# Patient Record
Sex: Female | Born: 1950 | Race: Black or African American | Hispanic: No | Smoking: Current every day smoker
Health system: Southern US, Community
[De-identification: ages and names within clinical notes are randomized; demographics above are authoritative.]

## PROBLEM LIST (undated history)

## (undated) DIAGNOSIS — B1920 Unspecified viral hepatitis C without hepatic coma: Secondary | ICD-10-CM

## (undated) DIAGNOSIS — C539 Malignant neoplasm of cervix uteri, unspecified: Secondary | ICD-10-CM

## (undated) DIAGNOSIS — B20 Human immunodeficiency virus [HIV] disease: Secondary | ICD-10-CM

## (undated) DIAGNOSIS — A630 Anogenital (venereal) warts: Secondary | ICD-10-CM

## (undated) DIAGNOSIS — B009 Herpesviral infection, unspecified: Secondary | ICD-10-CM

## (undated) DIAGNOSIS — B589 Toxoplasmosis, unspecified: Secondary | ICD-10-CM

## (undated) DIAGNOSIS — G629 Polyneuropathy, unspecified: Secondary | ICD-10-CM

## (undated) DIAGNOSIS — C649 Malignant neoplasm of unspecified kidney, except renal pelvis: Secondary | ICD-10-CM

## (undated) DIAGNOSIS — N2889 Other specified disorders of kidney and ureter: Secondary | ICD-10-CM

## (undated) HISTORY — DX: Malignant neoplasm of cervix uteri, unspecified: C53.9

## (undated) HISTORY — DX: Malignant neoplasm of unspecified kidney, except renal pelvis: C64.9

---

## 2011-12-02 ENCOUNTER — Encounter (HOSPITAL_COMMUNITY): Payer: Self-pay | Admitting: *Deleted

## 2011-12-02 ENCOUNTER — Emergency Department (INDEPENDENT_AMBULATORY_CARE_PROVIDER_SITE_OTHER)
Admission: EM | Admit: 2011-12-02 | Discharge: 2011-12-02 | Disposition: A | Discharge status: Home / Self Care | Payer: Medicaid Other | Source: Home / Self Care | Attending: Family Medicine | Admitting: Family Medicine

## 2011-12-02 DIAGNOSIS — N390 Urinary tract infection, site not specified: Secondary | ICD-10-CM

## 2011-12-02 HISTORY — DX: Human immunodeficiency virus (HIV) disease: B20

## 2011-12-02 LAB — POCT URINALYSIS DIP (DEVICE)
Glucose, UA: NEGATIVE mg/dL
Nitrite: NEGATIVE
Urobilinogen, UA: 0.2 mg/dL (ref 0.0–1.0)
pH: 6.5 (ref 5.0–8.0)

## 2011-12-02 MED ORDER — CEPHALEXIN 500 MG PO CAPS: 500.0000 mg | ORAL_CAPSULE | Freq: Four times a day (QID) | ORAL | Status: AC

## 2011-12-02 NOTE — ED Notes (Signed)
Pt  Seen  Seen  sev   Days  Ago  For  uti   She  Continues  To  Have  Symptoms  Of  painfull   Urination  Low  abd   She is  Taking  meds   Percocet    And  Bactrim         At  This  Time  -  Pt  States  She  Has  Aids  And      Is  Not taking  Any meds  For  Same      And  That      She  Recently  Came  Here  From  [place] yok  And  She  Has  No  Dr       -

## 2011-12-02 NOTE — ED Provider Notes (Addendum)
History     CSN: [id number]  Arrival date & time 12/02/11  1524   First MD Initiated Contact with Patient 12/02/11 1549      Chief Complaint  Patient presents with  . Urinary Tract Infection    (Consider location/radiation/quality/duration/timing/severity/associated sxs/prior treatment) Patient is a 61 y.o. female presenting with urinary tract infection. The history is provided by the patient.  Urinary Tract Infection This is a new problem. The current episode started more than 1 week ago. The problem has not changed (treated by lmd in [place] with bactrim, sx continue) since onset.Associated symptoms include abdominal pain. Associated symptoms comments: Has long and numerous medical problem list, requesting md for hiv and pain medicine..    Past Medical History  Diagnosis Date  . AIDS     History reviewed. No pertinent past surgical history.  History reviewed. No pertinent family history.  History  Substance Use Topics  . Smoking status: Not on file  . Smokeless tobacco: Not on file  . Alcohol Use:     OB History    Grav Para Term Preterm Abortions TAB SAB Ect Mult Living                  Review of Systems  Constitutional: Negative.   Gastrointestinal: Positive for abdominal pain. Negative for nausea and vomiting.  Genitourinary: Positive for dysuria and frequency.    Allergies  Aspirin  Home Medications  No current outpatient prescriptions on file.  BP 132/86  Pulse 74  Temp 97.7 F (36.5 C) (Oral)  Resp 20  SpO2 100%  Physical Exam  Nursing note and vitals reviewed. Constitutional: She is oriented to person, place, and time. She appears well-developed.  Abdominal: Soft. Bowel sounds are normal. There is tenderness. There is no rebound.  Neurological: She is alert and oriented to person, place, and time.  Skin: Skin is warm and dry.    ED Course  Procedures (including critical care time)  Labs Reviewed  POCT URINALYSIS DIP (DEVICE) - Abnormal;  Notable for the following:    Hgb urine dipstick TRACE (*)     Protein, ur 30 (*)     Leukocytes, UA MODERATE (*)  Biochemical Testing Only. Please order routine urinalysis from main lab if confirmatory testing is needed.   All other components within normal limits   No results found.   1. UTI (lower urinary tract infection)       MDM         Linna Hoff, MD 12/02/11 1636  Linna Hoff, MD 12/02/11 [phone]

## 2011-12-09 ENCOUNTER — Emergency Department (HOSPITAL_COMMUNITY)
Admission: EM | Admit: 2011-12-09 | Discharge: 2011-12-09 | Disposition: A | Discharge status: Home / Self Care | Payer: Medicaid Other | Attending: Emergency Medicine | Admitting: Emergency Medicine

## 2011-12-09 ENCOUNTER — Encounter (HOSPITAL_COMMUNITY): Payer: Self-pay | Admitting: Emergency Medicine

## 2011-12-09 DIAGNOSIS — R209 Unspecified disturbances of skin sensation: Secondary | ICD-10-CM | POA: Insufficient documentation

## 2011-12-09 DIAGNOSIS — M545 Low back pain, unspecified: Secondary | ICD-10-CM | POA: Insufficient documentation

## 2011-12-09 DIAGNOSIS — N898 Other specified noninflammatory disorders of vagina: Secondary | ICD-10-CM | POA: Insufficient documentation

## 2011-12-09 DIAGNOSIS — B009 Herpesviral infection, unspecified: Secondary | ICD-10-CM | POA: Insufficient documentation

## 2011-12-09 DIAGNOSIS — B20 Human immunodeficiency virus [HIV] disease: Secondary | ICD-10-CM | POA: Insufficient documentation

## 2011-12-09 DIAGNOSIS — Z79899 Other long term (current) drug therapy: Secondary | ICD-10-CM | POA: Insufficient documentation

## 2011-12-09 DIAGNOSIS — G629 Polyneuropathy, unspecified: Secondary | ICD-10-CM | POA: Insufficient documentation

## 2011-12-09 DIAGNOSIS — R5381 Other malaise: Secondary | ICD-10-CM | POA: Insufficient documentation

## 2011-12-09 DIAGNOSIS — M79609 Pain in unspecified limb: Secondary | ICD-10-CM | POA: Insufficient documentation

## 2011-12-09 DIAGNOSIS — B589 Toxoplasmosis, unspecified: Secondary | ICD-10-CM | POA: Insufficient documentation

## 2011-12-09 DIAGNOSIS — N939 Abnormal uterine and vaginal bleeding, unspecified: Secondary | ICD-10-CM

## 2011-12-09 DIAGNOSIS — R5383 Other fatigue: Secondary | ICD-10-CM | POA: Insufficient documentation

## 2011-12-09 DIAGNOSIS — N12 Tubulo-interstitial nephritis, not specified as acute or chronic: Secondary | ICD-10-CM | POA: Insufficient documentation

## 2011-12-09 DIAGNOSIS — R350 Frequency of micturition: Secondary | ICD-10-CM | POA: Insufficient documentation

## 2011-12-09 DIAGNOSIS — R109 Unspecified abdominal pain: Secondary | ICD-10-CM | POA: Insufficient documentation

## 2011-12-09 HISTORY — DX: Toxoplasmosis, unspecified: B58.9

## 2011-12-09 HISTORY — DX: Polyneuropathy, unspecified: G62.9

## 2011-12-09 HISTORY — DX: Herpesviral infection, unspecified: B00.9

## 2011-12-09 LAB — COMPREHENSIVE METABOLIC PANEL
AST: 62 U/L — ABNORMAL HIGH (ref 0–37)
Albumin: 2.7 g/dL — ABNORMAL LOW (ref 3.5–5.2)
CO2: 25 mEq/L (ref 19–32)
Calcium: 9.4 mg/dL (ref 8.4–10.5)
Creatinine, Ser: 0.68 mg/dL (ref 0.50–1.10)
GFR calc non Af Amer: >90 mL/min (ref >90)
Total Protein: 8.6 g/dL — ABNORMAL HIGH (ref 6.0–8.3)

## 2011-12-09 LAB — CBC WITH DIFFERENTIAL/PLATELET
Basophils Absolute: 0 10*3/uL (ref 0.0–0.1)
Eosinophils Absolute: 0.5 10*3/uL (ref 0.0–0.7)
Lymphs Abs: 1.3 10*3/uL (ref 0.7–4.0)
MCH: 23.6 pg — ABNORMAL LOW (ref 26.0–34.0)
MCHC: 32.9 g/dL (ref 30.0–36.0)
MCV: 71.6 fL — ABNORMAL LOW (ref 78.0–100.0)
Monocytes Absolute: 0.5 10*3/uL (ref 0.1–1.0)
Neutrophils Relative %: 49 % (ref 43–77)
Platelets: UNDETERMINED 10*3/uL (ref 150–400)
RDW: 17 % — ABNORMAL HIGH (ref 11.5–15.5)

## 2011-12-09 LAB — URINALYSIS, ROUTINE W REFLEX MICROSCOPIC
Bilirubin Urine: NEGATIVE
Hgb urine dipstick: NEGATIVE
Nitrite: NEGATIVE
Protein, ur: NEGATIVE mg/dL
Urobilinogen, UA: 0.2 mg/dL (ref 0.0–1.0)

## 2011-12-09 LAB — URINE MICROSCOPIC-ADD ON

## 2011-12-09 MED ORDER — SODIUM CHLORIDE 0.9 % IV BOLUS (SEPSIS)
1000.0000 mL | Freq: Once | INTRAVENOUS | Status: AC
  Administered 2011-12-09: 1000 mL via INTRAVENOUS

## 2011-12-09 MED ORDER — CIPROFLOXACIN HCL 500 MG PO TABS: 500.0000 mg | ORAL_TABLET | Freq: Two times a day (BID) | ORAL | Status: AC

## 2011-12-09 MED ORDER — ONDANSETRON HCL 4 MG/2ML IJ SOLN
4.0000 mg | INTRAMUSCULAR | Status: DC | PRN
  Administered 2011-12-09: 4 mg via INTRAVENOUS
  Filled 2011-12-09: qty 2

## 2011-12-09 MED ORDER — OXYCODONE-ACETAMINOPHEN 5-325 MG PO TABS: ORAL_TABLET | ORAL | Status: AC

## 2011-12-09 MED ORDER — MORPHINE SULFATE 4 MG/ML IJ SOLN
4.0000 mg | Freq: Once | INTRAMUSCULAR | Status: AC
  Administered 2011-12-09: 4 mg via INTRAVENOUS
  Filled 2011-12-09: qty 1

## 2011-12-09 MED ORDER — DEXTROSE 5 % IV SOLN
1.0000 g | Freq: Once | INTRAVENOUS | Status: AC
  Administered 2011-12-09: 1 g via INTRAVENOUS
  Filled 2011-12-09: qty 10

## 2011-12-09 NOTE — ED Provider Notes (Signed)
History     CSN: [id number]  Arrival date & time 12/09/11  1225   First MD Initiated Contact with Patient 12/09/11 1541      Chief Complaint  Patient presents with  . Back Pain  . Vaginal Discharge    (Consider location/radiation/quality/duration/timing/severity/associated sxs/prior treatment) HPI Comments: Patient with a hx of HIV/AIDS since 1990, toxoplasmosis, & herpes moved from [place] about a month ago & presents to ED w cc of low back and supra pubic pain.She reports low back pain that radiates to back of legs and groin with an onset of about one month ago, gradually worsening.She was seen by urgent care last week and dx w a UTI, treated with bactrim & keflex. SHe states she has been taking antibiotics for 7 days with out relief of back pain or urinary s/s including urinary frequency ("all day and night like every 5 min"). She also reports a chronic right side neuropathy that is worsening, yellow discharge with out an odor, & post menopausal cervical bleeding.  She reports her last CD4 count was about 6 months ago & was 12. She is not currently taking viral medications bc they  "turn toxic on me."  Patient denies any bowel or bladder incontinence, fevers, new extremity weakness or numbness, inability to ambulate, nausea, vomiting, or saddle paresthesias. Pt has been taking percocet for pain and has been out for 3 days.    Patient is a 61 y.o. female presenting with back pain and vaginal discharge. The history is provided by the patient.  Back Pain  Pertinent negatives include no fever, no numbness and no weakness.  Vaginal Discharge Associated symptoms include fatigue. Pertinent negatives include no chills, diaphoresis, fever, neck pain, numbness or weakness.    Past Medical History  Diagnosis Date  . AIDS   . Neuropathy   . Herpes   . Toxoplasmosis     History reviewed. No pertinent past surgical history.  History reviewed. No pertinent family history.  History  Substance Use  Topics  . Smoking status: Current Everyday Smoker  . Smokeless tobacco: Not on file  . Alcohol Use: No    OB History    Grav Para Term Preterm Abortions TAB SAB Ect Mult Living                  Review of Systems  Constitutional: Positive for fatigue. Negative for fever, chills, diaphoresis and unexpected weight change.  HENT: Negative for neck pain and neck stiffness.   Genitourinary: Positive for vaginal discharge.  Musculoskeletal: Positive for back pain.  Neurological: Negative for speech difficulty, weakness and numbness.    Allergies  Aspirin  Home Medications   Current Outpatient Rx  Name Route Sig Dispense Refill  . CEPHALEXIN 500 MG PO CAPS Oral Take 1 capsule (500 mg total) by mouth 4 (four) times daily. Take all of medicine and drink lots of fluids 20 capsule 0  . OXYCODONE-ACETAMINOPHEN 10-325 MG PO TABS Oral Take 1 tablet by mouth every 4 (four) hours as needed. For pain    . SULFAMETHOXAZOLE-TMP DS 800-160 MG PO TABS Oral Take 1 tablet by mouth 2 (two) times daily.      BP 128/93  Pulse 72  Temp 98.2 F (36.8 C) (Oral)  Resp 16  SpO2 100%  Physical Exam  Constitutional: She is oriented to person, place, and time. She appears well-developed and well-nourished. No distress.  HENT:  Head: Normocephalic and atraumatic.  Mouth/Throat: Oropharynx is clear and moist. No oropharyngeal exudate.  Eyes: Conjunctivae and EOM are normal. Pupils are equal, round, and reactive to light. No scleral icterus.  Neck: Normal range of motion. Neck supple. No tracheal deviation present. No thyromegaly present.  Cardiovascular: Normal rate, regular rhythm, normal heart sounds and intact distal pulses.   Pulmonary/Chest: Effort normal and breath sounds normal. No stridor. No respiratory distress. She has no wheezes.  Abdominal: Soft. There is tenderness.         Suprapubic abdominal tenderness to palpation.  Bowel sounds present and normal, no masses or distention.    Genitourinary:       Left sided CVA tenderness, external genitalia normal, rectal condylomata present. NO tender lesions. Bimanual non tender with out palpable masses. Cervix unable to be visualized d/t amount of vaginal bleeding. No purulent dc seen.   Musculoskeletal: Normal range of motion. She exhibits tenderness. She exhibits no edema.       Lumbar and left CVA tenderness.   Neurological: She is alert and oriented to person, place, and time. Coordination normal.       Patellar reflexes equal bilaterally.  Strength 5/5 in all extremities except right lower extremity 4/5.  Patient able to ambulate in the emergency department without difficulty.  Skin: Skin is warm and dry. No rash noted. She is not diaphoretic. No erythema. No pallor.  Psychiatric: She has a normal mood and affect. Her behavior is normal.    ED Course  Procedures (including critical care time)  Labs Reviewed  URINALYSIS, ROUTINE W REFLEX MICROSCOPIC - Abnormal; Notable for the following:    APPearance HAZY (*)     Leukocytes, UA MODERATE (*)     All other components within normal limits  CBC WITH DIFFERENTIAL - Abnormal; Notable for the following:    Hemoglobin 10.6 (*)     HCT 32.2 (*)     MCV 71.6 (*)     MCH 23.6 (*)     RDW 17.0 (*)     Eosinophils Relative 11 (*)     All other components within normal limits  COMPREHENSIVE METABOLIC PANEL - Abnormal; Notable for the following:    Total Protein 8.6 (*)     Albumin 2.7 (*)     AST 62 (*)     Total Bilirubin 0.1 (*)     All other components within normal limits  URINE MICROSCOPIC-ADD ON - Abnormal; Notable for the following:    Squamous Epithelial / LPF FEW (*)     All other components within normal limits  POCT PREGNANCY, URINE  URINE CULTURE  GC/CHLAMYDIA PROBE AMP, GENITAL  WET PREP, GENITAL   No results found.   No diagnosis found.    MDM    Patient treated in the emergency department with 1 g of Rocephin.  She will be discharged on  different medications because the Keflex and Bactrim has not worked for her urinary tract infection.  Patient will be given or problem.  Advised patient to contact health serve for followup with infectious disease and women[facility] clinic for vaginal bleeding.  Patient is stable in no acute distress prior to discharge, sitting comfortably in bed and eating. Pt case was discussed with Dr. Lynelle Doctor who agrees with my plan to dc pt with strict follow up instructions. Pt verbalizes understanding.         Jaci Carrel, [place] 12/09/11 [phone]

## 2011-12-09 NOTE — ED Notes (Signed)
Pt c/o lower back pain with radiation to bil legs x 3 days; pt recently being treated for UTI; pt sts just moved here from [place] and is out of pain meds; pt sts not taking HIV meds

## 2011-12-09 NOTE — ED Notes (Signed)
Pt also c/o yellow vaginal discharge and vaginal bleeding; pt denies odor

## 2011-12-09 NOTE — ED Notes (Signed)
Unable to complete pelvic sample due to large amounts of blood. Pt given washcloths and pads.

## 2011-12-09 NOTE — ED Provider Notes (Signed)
Medical screening examination/treatment/procedure(s) were conducted as a shared visit with non-physician practitioner(s) and myself.  I personally evaluated the patient during the encounter  Pt with several medical problems.  No PCP in this area.  Still having back pain.  Non toxic.  No guarding or rigidity on abdominal exam.  Mild ttp left lq.  On exam she does have flank tenderness and a persistent uti.  Will give dose of IV abx, change her abx regimen.   Celene Kras, MD 12/09/11 [phone]

## 2012-01-14 ENCOUNTER — Encounter (HOSPITAL_COMMUNITY): Payer: Self-pay

## 2012-01-14 ENCOUNTER — Inpatient Hospital Stay (HOSPITAL_COMMUNITY): Payer: Medicaid Other

## 2012-01-14 ENCOUNTER — Inpatient Hospital Stay (HOSPITAL_COMMUNITY)
Admission: AD | Admit: 2012-01-14 | Discharge: 2012-01-14 | Disposition: A | Discharge status: Home / Self Care | Payer: Medicaid Other | Source: Ambulatory Visit | Attending: Obstetrics & Gynecology | Admitting: Obstetrics & Gynecology

## 2012-01-14 DIAGNOSIS — N939 Abnormal uterine and vaginal bleeding, unspecified: Secondary | ICD-10-CM

## 2012-01-14 DIAGNOSIS — N888 Other specified noninflammatory disorders of cervix uteri: Secondary | ICD-10-CM

## 2012-01-14 DIAGNOSIS — N938 Other specified abnormal uterine and vaginal bleeding: Secondary | ICD-10-CM | POA: Insufficient documentation

## 2012-01-14 DIAGNOSIS — N898 Other specified noninflammatory disorders of vagina: Secondary | ICD-10-CM

## 2012-01-14 DIAGNOSIS — N949 Unspecified condition associated with female genital organs and menstrual cycle: Secondary | ICD-10-CM | POA: Insufficient documentation

## 2012-01-14 HISTORY — DX: Human immunodeficiency virus (HIV) disease: B20

## 2012-01-14 HISTORY — DX: Unspecified viral hepatitis C without hepatic coma: B19.20

## 2012-01-14 HISTORY — DX: Other specified disorders of kidney and ureter: N28.89

## 2012-01-14 HISTORY — DX: Anogenital (venereal) warts: A63.0

## 2012-01-14 LAB — URINALYSIS, ROUTINE W REFLEX MICROSCOPIC
Bilirubin Urine: NEGATIVE
Nitrite: NEGATIVE
Specific Gravity, Urine: 1.015 (ref 1.005–1.030)
pH: 6 (ref 5.0–8.0)

## 2012-01-14 LAB — CBC
HCT: 28.8 % — ABNORMAL LOW (ref 36.0–46.0)
Hemoglobin: 9.1 g/dL — ABNORMAL LOW (ref 12.0–15.0)
MCH: 22.5 pg — ABNORMAL LOW (ref 26.0–34.0)
MCV: 71.1 fL — ABNORMAL LOW (ref 78.0–100.0)
RBC: 4.05 MIL/uL (ref 3.87–5.11)

## 2012-01-14 LAB — URINE MICROSCOPIC-ADD ON

## 2012-01-14 LAB — WET PREP, GENITAL
WBC, Wet Prep HPF POC: NONE SEEN
Yeast Wet Prep HPF POC: NONE SEEN

## 2012-01-14 MED ORDER — OXYCODONE-ACETAMINOPHEN 5-325 MG PO TABS: 2.0000 | ORAL_TABLET | ORAL | Status: DC | PRN

## 2012-01-14 MED ORDER — KETOROLAC TROMETHAMINE 60 MG/2ML IM SOLN: 60.0000 mg | Freq: Once | INTRAMUSCULAR | Status: DC

## 2012-01-14 MED ORDER — OXYCODONE-ACETAMINOPHEN 5-325 MG PO TABS
1.0000 | ORAL_TABLET | Freq: Once | ORAL | Status: AC
  Administered 2012-01-14: 1 via ORAL
  Filled 2012-01-14: qty 1

## 2012-01-14 NOTE — MAU Note (Addendum)
Back pain and leg pain reports vaginal bleeding for several months, varies in amount, none at present, has history of vaginal warts, HIV positive, herpes outbreak currently, Hepatitis C, currently using crack cocaine.

## 2012-01-14 NOTE — MAU Provider Note (Signed)
History     CSN: [id number]  Arrival date and time: 01/14/12 1013   First Provider Initiated Contact with Patient 01/14/12 1051      Chief Complaint  Patient presents with  . Vaginal Bleeding  . Back Pain   HPI Christine Vincent 61 y.o. Comes to MAU today with vaginal bleeding that occurred 3 days ago.  No vaginal bleeding today but client states that with a vaginal exam she will have bleeding.  Was seen at [facility] on 01-07-12 by infectious disease and had vaginal bleeding with pelvic exam and provider could not finish the exam.  Has [place] and is getting established here.  She has numerous chronic problems including HIV and a known mass on her kidney.  States she has an appointment at [facility] on 01-27-12 with the pharmacy to get her HIV medications. Currently is not taking HIV meds.  Diagosed in 1990 per client.  Also has an appointment at [facility] on 01-28-12 with Primary Care.  States she was scheduled for OB/GYN appointment as well in October (does not remember the specific day, but states she has it written down).  Client is crying with pain in her groin, lower abdomen and back.  Wants medication for pain and has not taken any medication for pain today.  Client reports she did use cocaine for pain control earlier this week.  OB History    Grav Para Term Preterm Abortions TAB SAB Ect Mult Living                  Past Medical History  Diagnosis Date  . AIDS   . Neuropathy   . Herpes   . Toxoplasmosis   . HIV disease   . Herpes   . Neuropathy   . Kidney mass   . Hepatitis C virus   . Vaginal venereal warts     No past surgical history on file.  Family History  Problem Relation Age of Onset  . Hypertension Maternal Grandmother     History  Substance Use Topics  . Smoking status: Current Everyday Smoker  . Smokeless tobacco: Not on file  . Alcohol Use: No    Allergies:  Allergies  Allergen Reactions  . Aspirin Hives, Itching and Nausea And Vomiting      Prescriptions prior to admission  Medication Sig Dispense Refill  . ciprofloxacin (CIPRO) 500 MG tablet Take 500 mg by mouth 2 (two) times daily.      Marland Kitchen oxyCODONE-acetaminophen (PERCOCET) 10-325 MG per tablet Take 1 tablet by mouth every 4 (four) hours as needed. For pain      . sulfamethoxazole-trimethoprim (BACTRIM DS,SEPTRA DS) 800-160 MG per tablet Take 1 tablet by mouth 2 (two) times daily. Bottle says take 2 times a week      . valACYclovir (VALTREX) 1000 MG tablet Take 1,000 mg by mouth daily.        Review of Systems  Constitutional: Negative for fever.  Gastrointestinal: Positive for abdominal pain and constipation. Negative for diarrhea.  Genitourinary: Negative for dysuria.       Vaginal bleeding HSV outbreak currently Genital warts   Physical Exam   Blood pressure 134/95, pulse 84, temperature 98.1 F (36.7 C), temperature source Oral, resp. rate 16.  Physical Exam  Nursing note and vitals reviewed. Constitutional: She is oriented to person, place, and time. She appears well-developed and well-nourished.  HENT:  Head: Normocephalic.       Very poor dentition.  Only  has a few teeth with obvious decay at base of teeth.  Eyes: EOM are normal.  Neck: Neck supple.  GI: Soft. There is no rebound and no guarding.       Reports tenderness all across lower abdomen with palpation.  Genitourinary:       Speculum exam: Vulva - Multiple Hypopigmented papules seen on either side of rectum - nontender.  Erosion seen in folds between labia majora and labia minora - very tender.  Cloudy discharge in small stream from introitus Vagina - Initially with speculum, no bleeding seen.  Opened speculum twice to visualize cervix and bleeding began.  Cervix has highly exophytic and geographic appearance.  Almost immediately, speculum filled with liquid blood and small clots.   Cervix - heavy contact bleeding noted Bimanual exam: Cervix long, soft with soft, irregular texture rather than  smooth texture to palpation Uterus non tender, very firm, retroflexed, 8 week size Adnexa non tender, no masses bilaterally GC/Chlam, wet prep done Chaperone present for exam.  Musculoskeletal: Normal range of motion.  Neurological: She is alert and oriented to person, place, and time.  Skin: Skin is warm and dry.  Psychiatric: She has a normal mood and affect.    MAU Course  Procedures *RADIOLOGY REPORT*  Clinical Data: Pelvic pain. Post menopausal bleeding.  TRANSABDOMINAL AND TRANSVAGINAL ULTRASOUND OF PELVIS  Technique: Both transabdominal and transvaginal ultrasound  examinations of the pelvis were performed. Transabdominal  technique was performed for global imaging of the pelvis including  uterus, ovaries, adnexal regions, and pelvic cul-de-sac.  It was necessary to proceed with endovaginal exam following the  transabdominal exam to visualize the endometrium and ovaries.  Comparison: None.  Findings:  Uterus: 9.4 x 5.0 x 5.4 cm. Retroflexed. Diffusely heterogeneous  echogenicity of uterine myometrium noted, without distinct fibroids  identified. Hypoechoic fluid or blood seen within the endocervical  canal. The posterior wall of the cervix appears enlarged with an  ill-defined hyperechoic area measuring approximately 2.9 x 4.8 by  2.9 cm. A posterior cervical mass cannot be excluded.  Endometrium: Tiny amount of fluid seen in the fundal portion of  endometrial cavity. Double layer thickness excluding the fluid  measures 9 mm transvaginally. No focal lesion visualized.  Right ovary: 2.5 x 2.0 x 2.5 cm. Normal appearance. No adnexal  mass identified.  Left ovary: not directly visualized by transabdominal or  transvaginal sonography, however no adnexal mass identified.  Other Findings: No free fluid  IMPRESSION:  1. Endometrial thickness measures 9 mm transvaginally. In the  setting of post-menopausal bleeding, endometrial sampling is  indicated to exclude carcinoma. If  results are benign,  sonohysterogram should be considered for focal lesion work-up.  (Ref: Radiological Reasoning: Algorithmic Workup of Abnormal  Vaginal Bleeding with Endovaginal Sonography and Sonohysterography.  AJR 2008; 130:Q65-78)  2. Question mass in the posterior cervical wall. Cervical  carcinoma cannot be excluded by imaging. Recommend clinical  correlation with pelvic exam, and consider pelvic MRI without and  with contrast for further evaluation if clinically warranted.  3. Normal right ovary. Nonvisualization of left ovary, however no  adnexal mass identified.   MDM 1130 am Consult with Dr. Debroah Loop re: plan of care Results for orders placed during the hospital encounter of 01/14/12 (from the past 24 hour(s))  URINALYSIS, ROUTINE W REFLEX MICROSCOPIC     Status: Abnormal   Collection Time   01/14/12 10:17 AM      Component Value Range   Color, Urine YELLOW  YELLOW  APPearance CLEAR  CLEAR   Specific Gravity, Urine 1.015  1.005 - 1.030   pH 6.0  5.0 - 8.0   Glucose, UA NEGATIVE  NEGATIVE mg/dL   Hgb urine dipstick TRACE (*) NEGATIVE   Bilirubin Urine NEGATIVE  NEGATIVE   Ketones, ur NEGATIVE  NEGATIVE mg/dL   Protein, ur NEGATIVE  NEGATIVE mg/dL   Urobilinogen, UA 0.2  0.0 - 1.0 mg/dL   Nitrite NEGATIVE  NEGATIVE   Leukocytes, UA SMALL (*) NEGATIVE  URINE MICROSCOPIC-ADD ON     Status: Abnormal   Collection Time   01/14/12 10:17 AM      Component Value Range   Squamous Epithelial / LPF FEW (*) RARE   WBC, UA 11-20  <3 WBC/hpf   RBC / HPF 0-2  <3 RBC/hpf   Bacteria, UA RARE  RARE  WET PREP, GENITAL     Status: Normal   Collection Time   01/14/12 10:40 AM      Component Value Range   Yeast Wet Prep HPF POC NONE SEEN  NONE SEEN   Trich, Wet Prep NONE SEEN  NONE SEEN   Clue Cells Wet Prep HPF POC NONE SEEN  NONE SEEN   WBC, Wet Prep HPF POC NONE SEEN  NONE SEEN  CBC     Status: Abnormal   Collection Time   01/14/12 11:30 AM      Component Value Range   WBC 5.7   4.0 - 10.5 K/uL   RBC 4.05  3.87 - 5.11 MIL/uL   Hemoglobin 9.1 (*) 12.0 - 15.0 g/dL   HCT 40.9 (*) 81.1 - 91.4 %   MCV 71.1 (*) 78.0 - 100.0 fL   MCH 22.5 (*) 26.0 - 34.0 pg   MCHC 31.6  30.0 - 36.0 g/dL   RDW 78.2 (*) 95.6 - 21.3 %   Platelets 58 (*) 150 - 400 K/uL   Assessment and Plan  Abnormal vaginal bleeding likely from cervical mass and possibly from thickened endometrial lining Numerous health problems which are currently not being treated.  Plan Will need to see OB/GYN MD for further evaluation. Will need endometrial biopsy and pelvic MRI for evaluation. Nothing in the vagina - no sex, no tampons, no douching. Cultures pending. Client reports she has appointments in [place] on 8-27 and 8-28.  Advise she tell her providers about the visit here today so her records can be reviewed. Advised if she needs care prior to that time, she will need to be seen in the ER in [place] so her doctors in [place] can be consulted.  Jerriyah Louis 01/14/2012, 11:01 AM

## 2012-01-15 LAB — GC/CHLAMYDIA PROBE AMP, GENITAL: Chlamydia, DNA Probe: NEGATIVE

## 2012-01-20 ENCOUNTER — Emergency Department (HOSPITAL_COMMUNITY): Payer: Medicaid Other

## 2012-01-20 ENCOUNTER — Emergency Department (HOSPITAL_COMMUNITY)
Admission: EM | Admit: 2012-01-20 | Discharge: 2012-01-20 | Disposition: A | Discharge status: Home / Self Care | Payer: Medicaid Other | Attending: Emergency Medicine | Admitting: Emergency Medicine

## 2012-01-20 ENCOUNTER — Encounter (HOSPITAL_COMMUNITY): Payer: Self-pay | Admitting: *Deleted

## 2012-01-20 DIAGNOSIS — R5383 Other fatigue: Secondary | ICD-10-CM | POA: Insufficient documentation

## 2012-01-20 DIAGNOSIS — M545 Low back pain, unspecified: Secondary | ICD-10-CM | POA: Insufficient documentation

## 2012-01-20 DIAGNOSIS — Z79899 Other long term (current) drug therapy: Secondary | ICD-10-CM | POA: Insufficient documentation

## 2012-01-20 DIAGNOSIS — W1809XA Striking against other object with subsequent fall, initial encounter: Secondary | ICD-10-CM | POA: Insufficient documentation

## 2012-01-20 DIAGNOSIS — Z8619 Personal history of other infectious and parasitic diseases: Secondary | ICD-10-CM | POA: Insufficient documentation

## 2012-01-20 DIAGNOSIS — T148XXA Other injury of unspecified body region, initial encounter: Secondary | ICD-10-CM

## 2012-01-20 DIAGNOSIS — W1800XA Striking against unspecified object with subsequent fall, initial encounter: Secondary | ICD-10-CM

## 2012-01-20 DIAGNOSIS — S0990XA Unspecified injury of head, initial encounter: Secondary | ICD-10-CM | POA: Insufficient documentation

## 2012-01-20 DIAGNOSIS — R5381 Other malaise: Secondary | ICD-10-CM | POA: Insufficient documentation

## 2012-01-20 LAB — URINE CULTURE

## 2012-01-20 MED ORDER — OXYCODONE-ACETAMINOPHEN 10-325 MG PO TABS: 1.0000 | ORAL_TABLET | Freq: Three times a day (TID) | ORAL | Status: DC | PRN

## 2012-01-20 MED ORDER — OXYCODONE-ACETAMINOPHEN 5-325 MG PO TABS
2.0000 | ORAL_TABLET | Freq: Once | ORAL | Status: AC
  Administered 2012-01-20: 2 via ORAL
  Filled 2012-01-20: qty 2

## 2012-01-20 NOTE — ED Provider Notes (Signed)
Medical screening examination/treatment/procedure(s) were performed by non-physician practitioner and as supervising physician I was immediately available for consultation/collaboration.  Derwood Kaplan, MD 01/20/12 [phone]

## 2012-01-20 NOTE — Discharge Instructions (Signed)
Contusion A contusion is a deep bruise. Contusions happen when an injury causes bleeding under the skin. Signs of bruising include pain, puffiness (swelling), and discolored skin. The contusion may turn blue, purple, or yellow. HOME CARE   Put ice on the injured area.   Put ice in a plastic bag.   Place a towel between your skin and the bag.   Leave the ice on for 15 to 20 minutes, 3 to 4 times a day.   Only take medicine as told by your doctor.   Rest the injured area.   If possible, raise (elevate) the injured area to lessen puffiness.  GET HELP RIGHT AWAY IF:   You have more bruising or puffiness.   You have pain that is getting worse.   Your puffiness or pain is not helped by medicine.  MAKE SURE YOU:   Understand these instructions.   Will watch your condition.   Will get help right away if you are not doing well or get worse.  Document Released: 11/05/2007 Document Revised: 05/08/2011 Document Reviewed: 03/24/2011 [facility] Patient Information 2012 [place], [place].  Fortunately, her x-rays show no fractures or intracranial brain injuries

## 2012-01-20 NOTE — ED Notes (Signed)
EMS reports after they gave her 350 mcg of Fentanyl she told them she did a small rock of cocaine

## 2012-01-20 NOTE — ED Provider Notes (Signed)
History     CSN: [id number]  Arrival date & time 01/20/12  0117   First MD Initiated Contact with Patient 01/20/12 0158      Chief Complaint  Patient presents with  . Fall    (Consider location/radiation/quality/duration/timing/severity/associated sxs/prior treatment) HPI Comments: Patient states she has chronic low back pain, radiating to her left leg and weakness.  States she was attempting to get off the toilet, and her leg gave out.  She fell 4 hit her head on the wall, and then fell backward, hitting her lower back on the sink.  She laid on the floor for approximately one hour before EMS arrived to assist.  Her.  She states she is out of her Percocet for the last 3 or 4 days.  She is waiting for an appointment for primary care physician at [facility].  She take place, the end of this month.  She recently moved to [place]  Patient is a 61 y.o. female presenting with fall. The history is provided by the patient.  Fall The accident occurred 1 to 2 hours ago. The fall occurred from a stool. She fell from a height of 1 to 2 ft. She landed on a hard floor. Pertinent negatives include no fever and no abdominal pain.    Past Medical History  Diagnosis Date  . AIDS   . Neuropathy   . Herpes   . Toxoplasmosis   . HIV disease   . Herpes   . Neuropathy   . Kidney mass   . Hepatitis C virus   . Vaginal venereal warts     History reviewed. No pertinent past surgical history.  Family History  Problem Relation Age of Onset  . Hypertension Maternal Grandmother     History  Substance Use Topics  . Smoking status: Current Everyday Smoker  . Smokeless tobacco: Not on file  . Alcohol Use: No    OB History    Grav Para Term Preterm Abortions TAB SAB Ect Mult Living                  Review of Systems  Constitutional: Negative for fever and chills.  Respiratory: Negative for shortness of breath.   Cardiovascular: Negative for leg swelling.  Gastrointestinal:  Negative for abdominal pain.  Musculoskeletal: Positive for back pain. Negative for myalgias and joint swelling.  Skin: Negative for rash and wound.  Neurological: Positive for weakness. Negative for dizziness.    Allergies  Aspirin  Home Medications   Current Outpatient Rx  Name Route Sig Dispense Refill  . CIPROFLOXACIN HCL 500 MG PO TABS Oral Take 500 mg by mouth 2 (two) times daily.    . OXYCODONE-ACETAMINOPHEN 10-325 MG PO TABS Oral Take 1 tablet by mouth every 4 (four) hours as needed. For pain    . SULFAMETHOXAZOLE-TRIMETHOPRIM 800-160 MG PO TABS Oral Take 1 tablet by mouth 2 (two) times daily. Bottle says take 2 times a week    . VALACYCLOVIR HCL 1 G PO TABS Oral Take 1,000 mg by mouth daily.      BP 149/93  Pulse 79  Temp 98.9 F (37.2 C) (Oral)  Resp 20  SpO2 94%  Physical Exam  Constitutional: She is oriented to person, place, and time. She appears well-developed and well-nourished.  HENT:  Head: Normocephalic.  Eyes: Pupils are equal, round, and reactive to light.  Neck: Normal range of motion.       Meets NEXIUS criteria  Cardiovascular: Normal rate.  Pulmonary/Chest: Effort normal.  Abdominal: Soft. She exhibits no distension. There is no tenderness.  Musculoskeletal: Normal range of motion. She exhibits tenderness.       Arms: Neurological: She is alert and oriented to person, place, and time.  Skin: Skin is warm.    ED Course  Procedures (including critical care time)  Labs Reviewed - No data to display Dg Lumbar Spine Complete  01/20/2012  *RADIOLOGY REPORT*  Clinical Data: Low back pain after fall.  LUMBAR SPINE - COMPLETE 4+ VIEW  Comparison: None.  Findings: The five lumbar type vertebra.  Normal alignment of the lumbar vertebrae and facet joints.  Intervertebral disc space heights are preserved.  No vertebral compression deformities.  No focal bone lesion or bone destruction.  Bone cortex and trabecular architecture appear intact.  IMPRESSION: No  displaced fractures identified.   Original Report Authenticated By: Marlon Pel, M.D.    Ct Head Wo Contrast  01/20/2012  *RADIOLOGY REPORT*  Clinical Data: Blunt trauma.  CT HEAD WITHOUT CONTRAST  Technique:  Contiguous axial images were obtained from the base of the skull through the vertex without contrast.  Comparison: None.  Findings: The ventricles and sulci are symmetrical without significant effacement, displacement, or dilatation. No mass effect or midline shift. No abnormal extra-axial fluid collections. The grey-white matter junction is distinct. Basal cisterns are not effaced. No acute intracranial hemorrhage. No depressed skull fractures.  Visualized paranasal sinuses and mastoid air cells are not opacified.  IMPRESSION: No acute intracranial abnormalities.   Original Report Authenticated By: Marlon Pel, M.D.      1. Fall against object   2. Contusion       MDM   X-ray, and CT scan have been reviewed.  There is no intracranial injury or subluxation/fracture of the lumbar region was prescribed, 10/325 Percocets #10-11.  Her to establish with I[facility] care physician in [place] at [facility]        Arman Filter, NP 01/20/12 0443  Arman Filter, NP 01/20/12 [phone]

## 2012-01-20 NOTE — ED Notes (Signed)
Patient was attempting to get off the toilet, fell front wards and hit her head on the wall, back then hit the sink and she fell to the floor.  Denies LOC, but states she hurts all over.

## 2012-01-20 NOTE — ED Notes (Signed)
Amb to the BR without difficulty  Attempting to call brother to come for her

## 2012-02-09 ENCOUNTER — Telehealth: Payer: Self-pay

## 2012-02-09 NOTE — Telephone Encounter (Signed)
Records received from [facility] . Patient was schedule for new intake visit on 02-24-12 @ 1:30 am.   Information requested to be mailed to patient. Appointment verified.     Laurell Josephs, RN

## 2012-02-24 ENCOUNTER — Ambulatory Visit: Payer: Medicaid Other

## 2012-02-24 ENCOUNTER — Ambulatory Visit: Payer: Self-pay

## 2012-03-17 ENCOUNTER — Telehealth: Payer: Self-pay

## 2012-03-17 NOTE — Telephone Encounter (Signed)
Message left on machine to call the office to schedule intake appointment.  Second attempt to reach patient. First attempt when records were received 01-09-12  Laurell Josephs, RN

## 2012-04-02 ENCOUNTER — Telehealth: Payer: Self-pay

## 2012-04-02 NOTE — Telephone Encounter (Signed)
Called patient at given number. The person who answered the phone suggested I call back on Monday.   Laurell Josephs , RN

## 2012-04-16 ENCOUNTER — Telehealth: Payer: Self-pay

## 2012-04-16 NOTE — Telephone Encounter (Signed)
Message left with female who answered the phone to please call Tammy @ [phone]. Laurell Josephs, RN  This is 3rd attempt to reach patient.   If no return call within one week I will make a Paramedic referral.

## 2012-06-07 ENCOUNTER — Telehealth: Payer: Self-pay

## 2012-06-07 NOTE — Telephone Encounter (Signed)
Multiple attempts made via phone to reach patient without success.  [employer] librarian made.   Lakeithia Rasor K Cleotha Tsang,RN

## 2012-07-16 ENCOUNTER — Encounter: Payer: Self-pay | Admitting: Radiation Oncology

## 2012-07-16 DIAGNOSIS — C539 Malignant neoplasm of cervix uteri, unspecified: Secondary | ICD-10-CM | POA: Insufficient documentation

## 2012-07-16 NOTE — Progress Notes (Signed)
62 year old. Female. Single. G10 P10.  Stage IIIB Cervical SCC, HIV positive, left parametrium involvement and unilateral hydronephrosis, no sidewall extension. Possible renal cell carcinoma.   AX: adhesive tape, pork, aspirin No hx of radiation therapy No indication of a pacemaker

## 2012-07-19 ENCOUNTER — Ambulatory Visit: Payer: Self-pay | Admitting: Radiation Oncology

## 2012-07-19 ENCOUNTER — Ambulatory Visit: Payer: Self-pay

## 2012-07-28 ENCOUNTER — Ambulatory Visit: Admission: RE | Admit: 2012-07-28 | Payer: Self-pay | Source: Ambulatory Visit

## 2012-07-28 ENCOUNTER — Encounter: Payer: Self-pay | Admitting: *Deleted

## 2012-07-28 ENCOUNTER — Ambulatory Visit: Admission: RE | Admit: 2012-07-28 | Payer: Self-pay | Source: Ambulatory Visit | Admitting: Radiation Oncology

## 2012-08-11 ENCOUNTER — Telehealth: Payer: Self-pay | Admitting: Radiation Oncology

## 2012-08-11 NOTE — Telephone Encounter (Signed)
[place] provided this Clinical research associate with returned letter from this patient indicating the patient had moved and left no return address. Phoned patient but, no answer. Requested patient contact our office to reschedule and update demographics. Awaiting return call.

## 2012-08-19 ENCOUNTER — Emergency Department (HOSPITAL_COMMUNITY)
Admission: EM | Admit: 2012-08-19 | Discharge: 2012-08-19 | Disposition: A | Discharge status: Home / Self Care | Payer: Medicaid Other | Attending: Emergency Medicine | Admitting: Emergency Medicine

## 2012-08-19 ENCOUNTER — Encounter (HOSPITAL_COMMUNITY): Payer: Self-pay | Admitting: *Deleted

## 2012-08-19 ENCOUNTER — Emergency Department (HOSPITAL_COMMUNITY): Payer: Medicaid Other

## 2012-08-19 DIAGNOSIS — Z8619 Personal history of other infectious and parasitic diseases: Secondary | ICD-10-CM | POA: Insufficient documentation

## 2012-08-19 DIAGNOSIS — Z87448 Personal history of other diseases of urinary system: Secondary | ICD-10-CM | POA: Insufficient documentation

## 2012-08-19 DIAGNOSIS — R5383 Other fatigue: Secondary | ICD-10-CM | POA: Insufficient documentation

## 2012-08-19 DIAGNOSIS — Z85528 Personal history of other malignant neoplasm of kidney: Secondary | ICD-10-CM | POA: Insufficient documentation

## 2012-08-19 DIAGNOSIS — B20 Human immunodeficiency virus [HIV] disease: Secondary | ICD-10-CM | POA: Insufficient documentation

## 2012-08-19 DIAGNOSIS — C539 Malignant neoplasm of cervix uteri, unspecified: Secondary | ICD-10-CM | POA: Insufficient documentation

## 2012-08-19 DIAGNOSIS — Z8669 Personal history of other diseases of the nervous system and sense organs: Secondary | ICD-10-CM | POA: Insufficient documentation

## 2012-08-19 DIAGNOSIS — F17200 Nicotine dependence, unspecified, uncomplicated: Secondary | ICD-10-CM | POA: Insufficient documentation

## 2012-08-19 DIAGNOSIS — Z79899 Other long term (current) drug therapy: Secondary | ICD-10-CM | POA: Insufficient documentation

## 2012-08-19 DIAGNOSIS — G8929 Other chronic pain: Secondary | ICD-10-CM | POA: Insufficient documentation

## 2012-08-19 DIAGNOSIS — Z8742 Personal history of other diseases of the female genital tract: Secondary | ICD-10-CM | POA: Insufficient documentation

## 2012-08-19 DIAGNOSIS — R5381 Other malaise: Secondary | ICD-10-CM | POA: Insufficient documentation

## 2012-08-19 LAB — CBC WITH DIFFERENTIAL/PLATELET
Basophils Absolute: 0 10*3/uL (ref 0.0–0.1)
Basophils Relative: 1 % (ref 0–1)
Eosinophils Absolute: 0.2 10*3/uL (ref 0.0–0.7)
Eosinophils Relative: 4 % (ref 0–5)
HCT: 32.2 % — ABNORMAL LOW (ref 36.0–46.0)
Hemoglobin: 10 g/dL — ABNORMAL LOW (ref 12.0–15.0)
Lymphocytes Relative: 26 % (ref 12–46)
Lymphs Abs: 1.2 10*3/uL (ref 0.7–4.0)
MCH: 20.6 pg — ABNORMAL LOW (ref 26.0–34.0)
MCHC: 31.1 g/dL (ref 30.0–36.0)
MCV: 66.4 fL — ABNORMAL LOW (ref 78.0–100.0)
Monocytes Absolute: 0.6 10*3/uL (ref 0.1–1.0)
Monocytes Relative: 13 % — ABNORMAL HIGH (ref 3–12)
Neutro Abs: 2.5 10*3/uL (ref 1.7–7.7)
Neutrophils Relative %: 56 % (ref 43–77)
Platelets: 206 10*3/uL (ref 150–400)
RBC: 4.85 MIL/uL (ref 3.87–5.11)
RDW: 18.4 % — ABNORMAL HIGH (ref 11.5–15.5)
WBC: 4.5 10*3/uL (ref 4.0–10.5)

## 2012-08-19 LAB — URINALYSIS, ROUTINE W REFLEX MICROSCOPIC
Bilirubin Urine: NEGATIVE
Glucose, UA: NEGATIVE mg/dL
Ketones, ur: NEGATIVE mg/dL
Nitrite: NEGATIVE
Protein, ur: NEGATIVE mg/dL
Specific Gravity, Urine: 1.017 (ref 1.005–1.030)
Urobilinogen, UA: 1 mg/dL (ref 0.0–1.0)
pH: 7 (ref 5.0–8.0)

## 2012-08-19 LAB — COMPREHENSIVE METABOLIC PANEL
ALT: 13 U/L (ref 0–35)
AST: 35 U/L (ref 0–37)
Albumin: 2.9 g/dL — ABNORMAL LOW (ref 3.5–5.2)
Alkaline Phosphatase: 83 U/L (ref 39–117)
BUN: 14 mg/dL (ref 6–23)
CO2: 23 mEq/L (ref 19–32)
Calcium: 9 mg/dL (ref 8.4–10.5)
Chloride: 101 mEq/L (ref 96–112)
Creatinine, Ser: 0.98 mg/dL (ref 0.50–1.10)
GFR calc Af Amer: 76 mL/min — ABNORMAL LOW (ref >90)
GFR calc non Af Amer: 65 mL/min — ABNORMAL LOW (ref >90)
Glucose, Bld: 84 mg/dL (ref 70–99)
Potassium: 3.9 mEq/L (ref 3.5–5.1)
Sodium: 135 mEq/L (ref 135–145)
Total Bilirubin: 0.2 mg/dL — ABNORMAL LOW (ref 0.3–1.2)
Total Protein: 8.8 g/dL — ABNORMAL HIGH (ref 6.0–8.3)

## 2012-08-19 LAB — POCT I-STAT TROPONIN I: Troponin i, poc: 0.01 ng/mL (ref 0.00–0.08)

## 2012-08-19 LAB — WET PREP, GENITAL
Trich, Wet Prep: NONE SEEN
Yeast Wet Prep HPF POC: NONE SEEN

## 2012-08-19 LAB — URINE MICROSCOPIC-ADD ON

## 2012-08-19 MED ORDER — OXYCODONE-ACETAMINOPHEN 5-325 MG PO TABS: 1.0000 | ORAL_TABLET | Freq: Four times a day (QID) | ORAL | Status: DC | PRN

## 2012-08-19 MED ORDER — OXYCODONE-ACETAMINOPHEN 5-325 MG PO TABS
1.0000 | ORAL_TABLET | Freq: Once | ORAL | Status: AC
  Administered 2012-08-19: 1 via ORAL
  Filled 2012-08-19: qty 1

## 2012-08-19 NOTE — ED Notes (Signed)
Pt arrives from home c/o generalized pain/weakness x's 5 days. Reports has been out of pain medication.

## 2012-08-19 NOTE — ED Notes (Signed)
Patient transported to X-ray 

## 2012-08-19 NOTE — ED Notes (Signed)
Pt from home, brought in by EMS with reports of fatigue, left abdominal/side pain, fever, and heavy vaginal discharge that started on Sunday. Pt denies N/V/D but reports not being able to eat.

## 2012-08-19 NOTE — ED Provider Notes (Signed)
History     CSN: [id number]  Arrival date & time 08/19/12  1850   First MD Initiated Contact with Patient 08/19/12 2101      Chief Complaint  Patient presents with  . Abdominal Pain    LLQ and L side  . Weakness    (Consider location/radiation/quality/duration/timing/severity/associated sxs/prior treatment) HPI Comments: This is a 62 year old female, past medical history remarkable for aids, hepatitis C, and cervical cancer who presents emergency department with chief complaint of vaginal discharge and abdominal pain. The symptoms are chronic. She requests to see an oncologist. She states that her pain is moderate, but it is controlled with oxycodone. She states that she recently transferred her records to her new oncologist, but has not been evaluated. She reports subjective fatigue and fever. Denies nausea, vomiting, diarrhea, chest pain, shortness of breath, and chills.  The history is provided by the patient. No language interpreter was used.    Past Medical History  Diagnosis Date  . AIDS   . Neuropathy   . Herpes   . Toxoplasmosis   . HIV disease   . Herpes   . Neuropathy   . Kidney mass   . Hepatitis C virus   . Vaginal venereal warts   . Cervical cancer   . Renal cell carcinoma     left kidney; presumable    History reviewed. No pertinent past surgical history.  Family History  Problem Relation Age of Onset  . Hypertension Maternal Grandmother   . Cancer Maternal Grandmother     lung ca  . Stroke Maternal Grandmother     History  Substance Use Topics  . Smoking status: Current Every Day Smoker -- 0.50 packs/day    Types: Cigarettes  . Smokeless tobacco: Never Used  . Alcohol Use: No    OB History   Grav Para Term Preterm Abortions TAB SAB Ect Mult Living                  Review of Systems  All other systems reviewed and are negative.    Allergies  Aspirin  Home Medications   Current Outpatient Rx  Name  Route  Sig  Dispense  Refill  .  docusate sodium (COLACE) 100 MG capsule   Oral   Take 100 mg by mouth daily as needed (constipation).         . elvitegravir-cobicistat-emtricitabine-tenofovir (STRIBILD) 150-150-200-300 MG TABS   Oral   Take 1 tablet by mouth daily with breakfast.         . fentaNYL (DURAGESIC - DOSED MCG/HR) 75 MCG/HR   Transdermal   Place 1 patch onto the skin every 3 (three) days.         Marland Kitchen oxyCODONE-acetaminophen (PERCOCET) 10-325 MG per tablet   Oral   Take 1 tablet by mouth every 8 (eight) hours as needed. For pain   10 tablet   0   . valACYclovir (VALTREX) 1000 MG tablet   Oral   Take 1,000 mg by mouth daily.           BP 144/100  Pulse 84  Temp(Src) 98.4 F (36.9 C) (Oral)  Resp 18  Wt 120 lb (54.432 kg)  SpO2 100%  Physical Exam  Nursing note and vitals reviewed. Constitutional: She is oriented to person, place, and time. She appears well-developed and well-nourished.  HENT:  Head: Normocephalic and atraumatic.  Eyes: Conjunctivae and EOM are normal. Pupils are equal, round, and reactive to light.  Neck: Normal range of  motion. Neck supple.  Cardiovascular: Normal rate and regular rhythm.  Exam reveals no gallop and no friction rub.   No murmur heard. Pulmonary/Chest: Effort normal and breath sounds normal. No respiratory distress. She has no wheezes. She has no rales. She exhibits no tenderness.  Abdominal: Soft. Bowel sounds are normal. She exhibits no distension and no mass. There is no tenderness. There is no rebound and no guarding.  Genitourinary: There is no rash, tenderness, lesion or injury on the right labia. There is no rash, tenderness, lesion or injury on the left labia. Cervix exhibits discharge and friability. Cervix exhibits no motion tenderness. Right adnexum displays no mass, no tenderness and no fullness. Left adnexum displays tenderness. Left adnexum displays no mass and no fullness. There is bleeding around the vagina. No erythema or tenderness around  the vagina. No foreign body around the vagina. No signs of injury around the vagina. No vaginal discharge found.  Thick white discharge from cervix, with some bleeding, however no hemorrhage  Musculoskeletal: Normal range of motion. She exhibits no edema and no tenderness.  Neurological: She is alert and oriented to person, place, and time.  Skin: Skin is warm and dry.  Psychiatric: She has a normal mood and affect. Her behavior is normal. Judgment and thought content normal.    ED Course  Procedures (including critical care time)  Labs Reviewed  WET PREP, GENITAL - Abnormal; Notable for the following:    WBC, Wet Prep HPF POC RARE (*)    All other components within normal limits  CBC WITH DIFFERENTIAL - Abnormal; Notable for the following:    Hemoglobin 10.0 (*)    HCT 32.2 (*)    MCV 66.4 (*)    MCH 20.6 (*)    RDW 18.4 (*)    Monocytes Relative 13 (*)    All other components within normal limits  COMPREHENSIVE METABOLIC PANEL - Abnormal; Notable for the following:    Total Protein 8.8 (*)    Albumin 2.9 (*)    Total Bilirubin 0.2 (*)    GFR calc non Af Amer 65 (*)    GFR calc Af Amer 76 (*)    All other components within normal limits  URINALYSIS, ROUTINE W REFLEX MICROSCOPIC - Abnormal; Notable for the following:    APPearance HAZY (*)    Hgb urine dipstick LARGE (*)    Leukocytes, UA SMALL (*)    All other components within normal limits  GC/CHLAMYDIA PROBE AMP  URINE MICROSCOPIC-ADD ON  POCT I-STAT TROPONIN I   Dg Chest 2 View  08/19/2012  *RADIOLOGY REPORT*  Clinical Data: Cough.  Shortness of breath.  Fever.  Smoker with history of cervical cancer.  CHEST - 2 VIEW  Comparison: None.  Findings: Emphysematous changes throughout both lungs with hyperinflation.  Lungs clear.  Bronchovascular markings normal. Pulmonary vascularity normal.  No pneumothorax.  No pleural effusions.  Cardiac silhouette upper normal in size to slightly enlarged for this degree of COPD.  Thoracic  aorta tortuous.  Hilar and mediastinal contours otherwise unremarkable.  Visualized bony thorax intact.  IMPRESSION: COPD/emphysema.  Borderline heart size.  No acute cardiopulmonary disease.   Original Report Authenticated By: Hulan Saas, M.D.      1. Cervical ca   2. Chronic pain       MDM     Patient discussed with Dr. Juleen China, reviewed labs and physical exam findings.  Patient is hemodynamically stable.  The patient will need to follow up with her oncologist.  I have given the patient the resource guide, in case she is unable to follow up with oncology.  As she is hemodynamically stable, and her pain is fairly well controlled in the ED, I will discharge her to home.  Patient asks to be admitted so she can see an oncologist, however, I explained to her that after reviewing her case with Dr. Juleen China, we do not see anything emergent or requiring admission at this time.  Therefore the patient will follow up on an outpatient basis.     Roxy Horseman, PA-C 08/20/12 [phone]

## 2012-08-20 LAB — GC/CHLAMYDIA PROBE AMP: CT Probe RNA: NEGATIVE

## 2012-08-23 NOTE — ED Provider Notes (Signed)
Medical screening examination/treatment/procedure(s) were performed by non-physician practitioner and as supervising physician I was immediately available for consultation/collaboration.  Raeford Razor, MD 08/23/12 [phone]

## 2012-09-22 ENCOUNTER — Encounter: Payer: Self-pay | Admitting: Radiation Oncology

## 2012-09-29 IMAGING — US US PELVIS COMPLETE
1 series · 13 of 25 positions shown · non-contrast
Comparison: None.

CLINICAL DATA: Pelvic pain.  Post menopausal bleeding.



[Series 1: us pelvis complete · 13 of 77 slices shown]
[im 1/77]
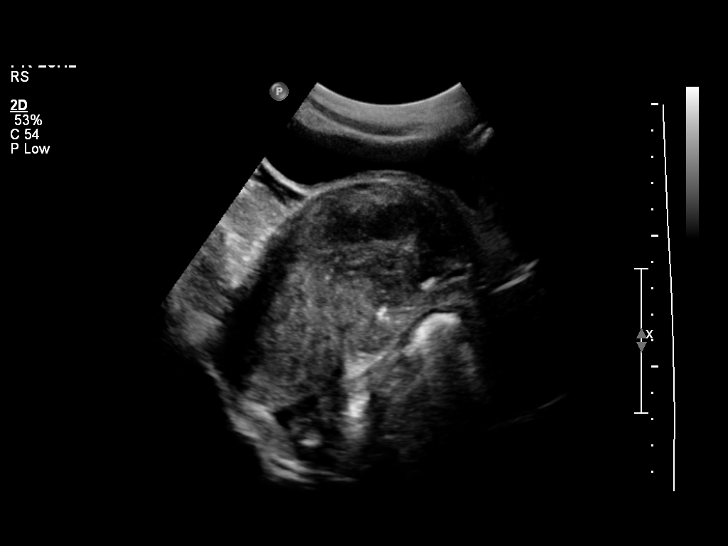
[im 7/77]
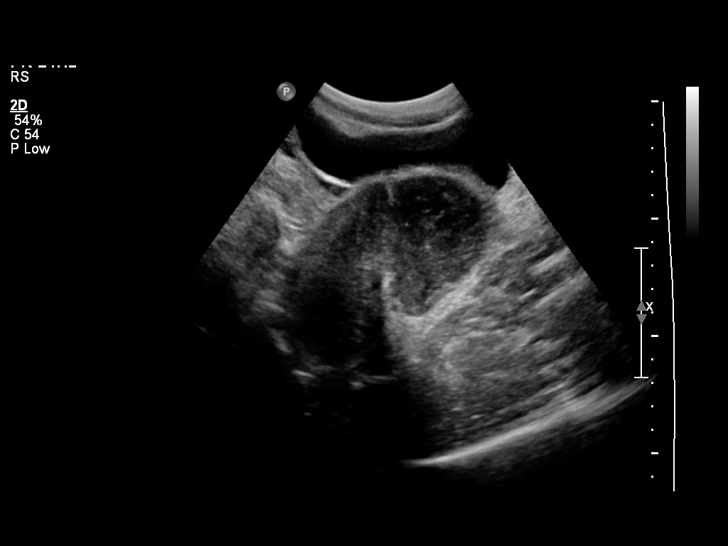
[im 13/77]
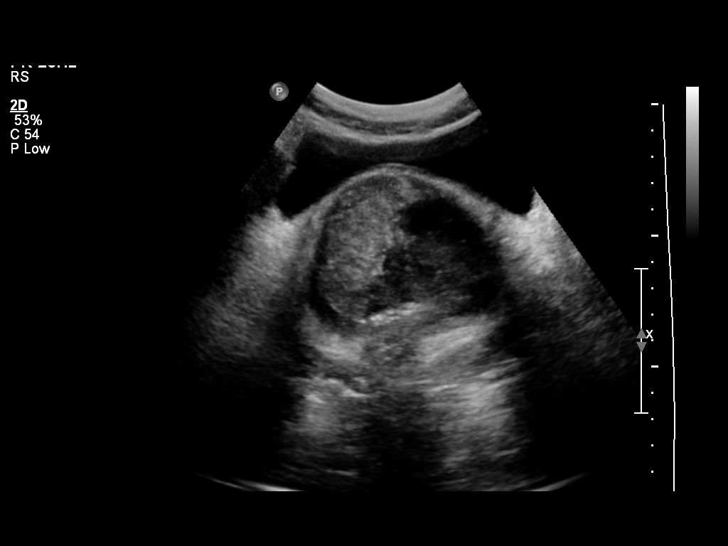
[im 20/77]
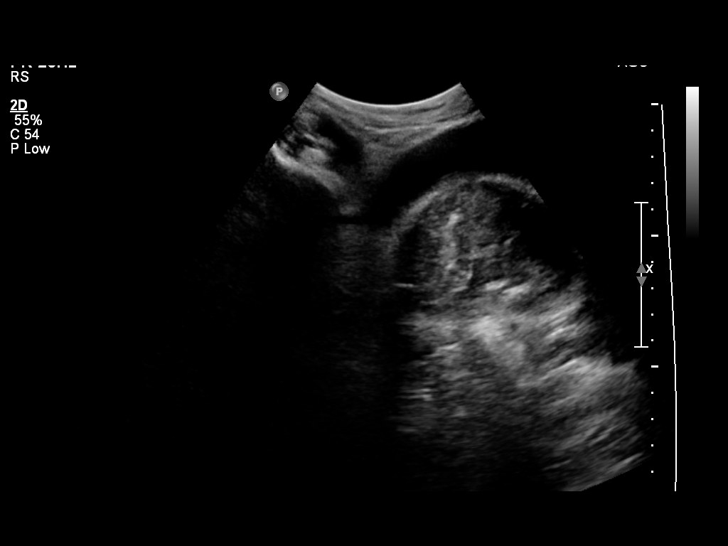
[im 26/77]
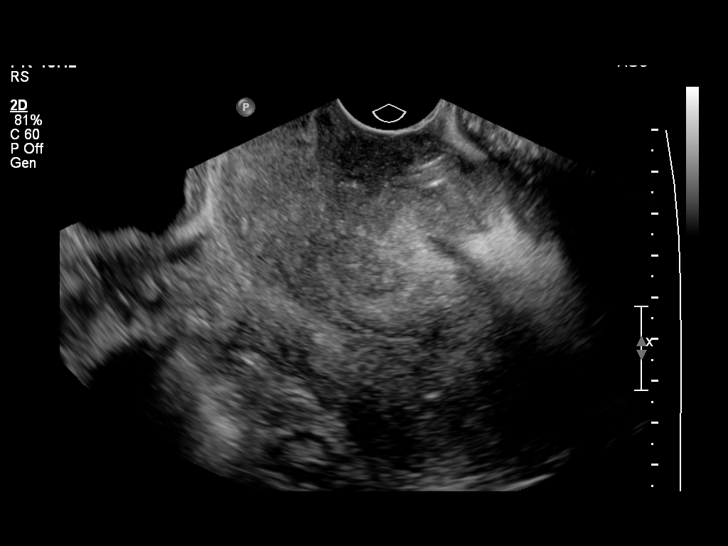
[im 32/77]
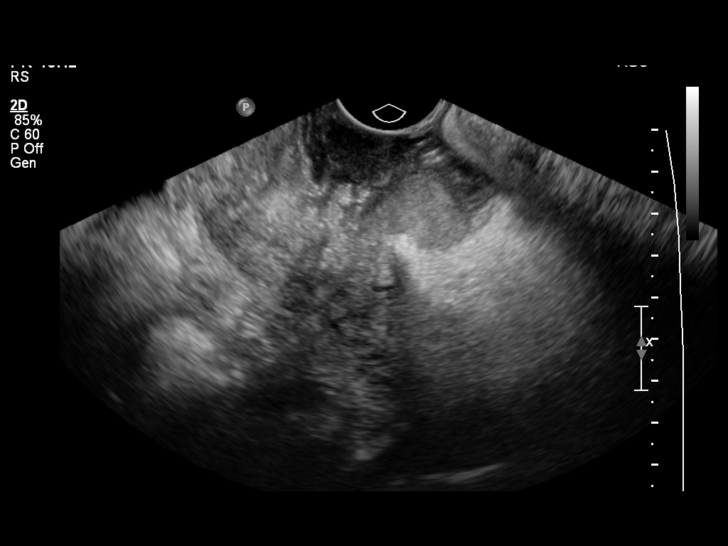
[im 39/77]
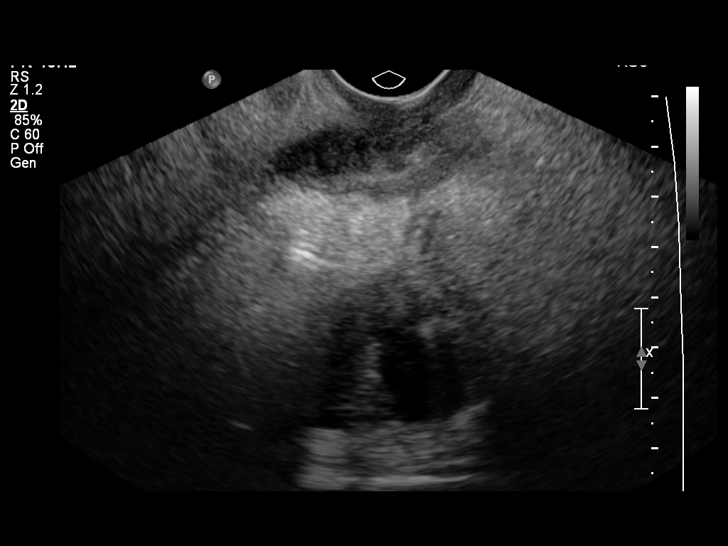
[im 45/77]
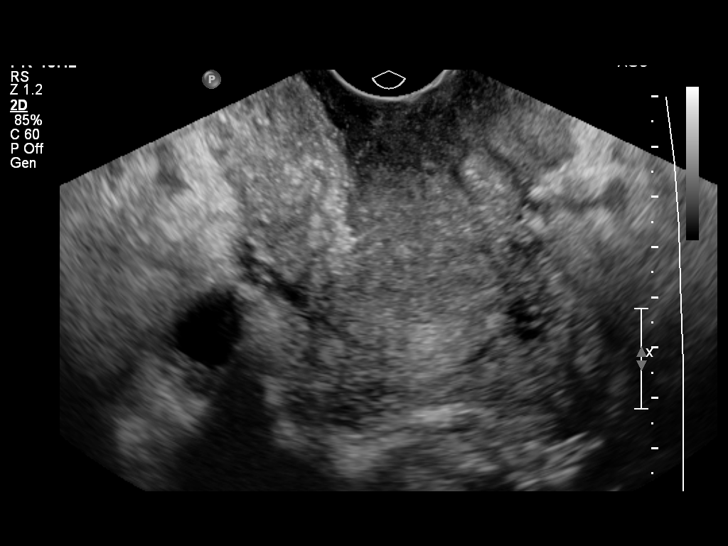
[im 51/77]
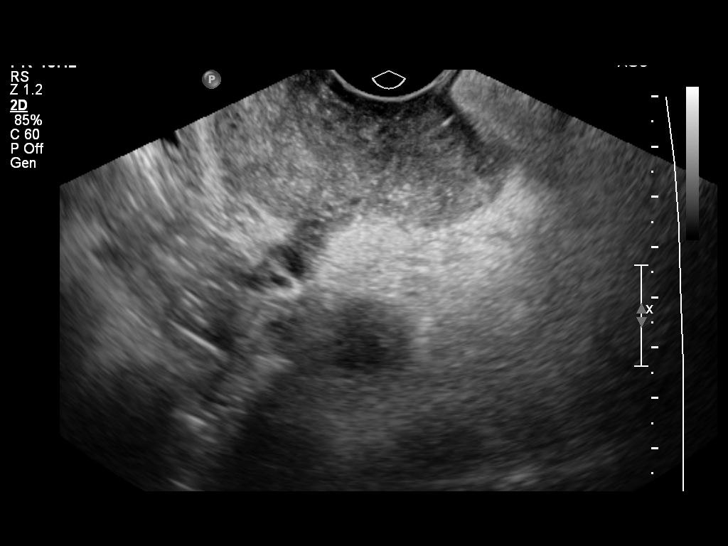
[im 58/77]
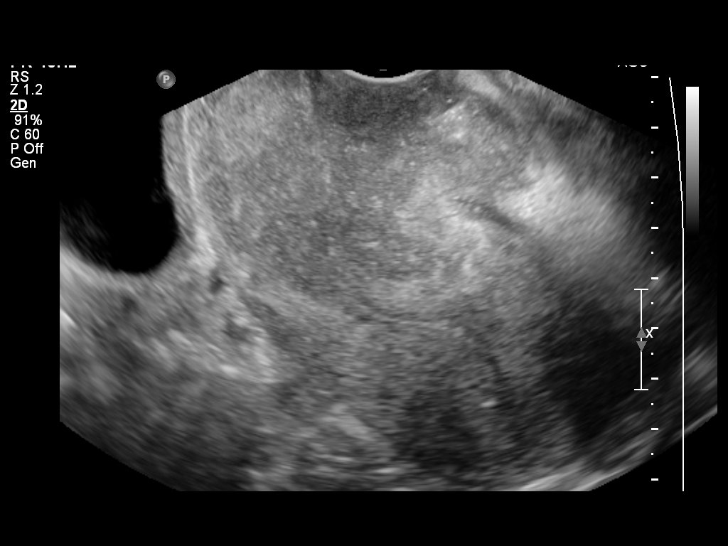
[im 64/77]
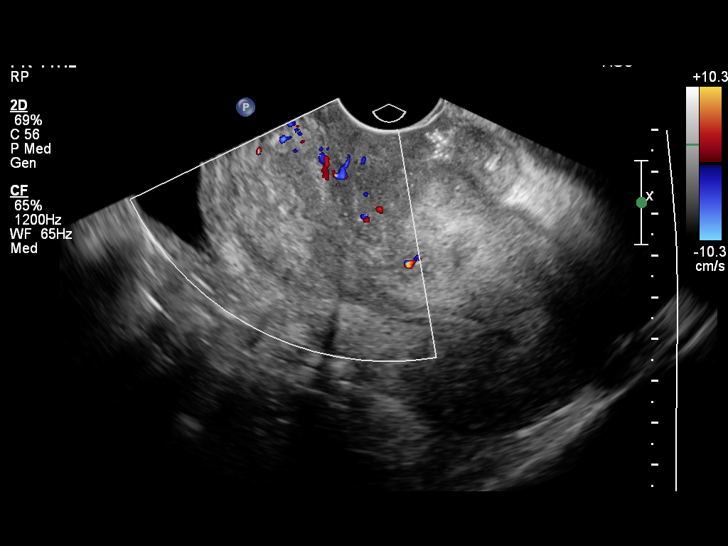
[im 70/77]
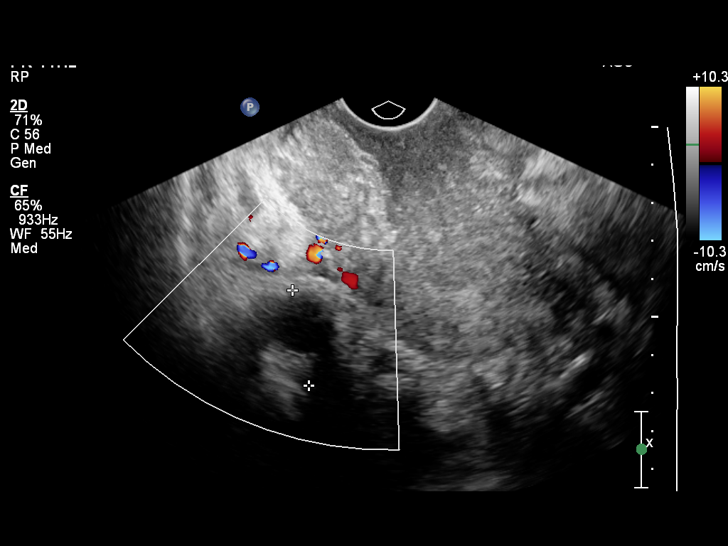
[im 77/77]
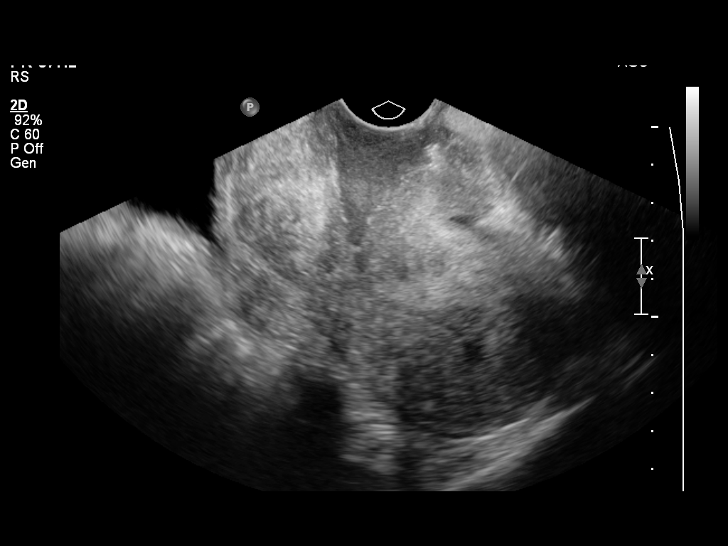

[13 of 25 positions shown; findings below may reference images not displayed]

FINDINGS: Uterus:  9.4 x 5.0 x 5.4 cm. Retroflexed.  Diffusely heterogeneous
echogenicity of uterine myometrium noted, without distinct fibroids
identified.  Hypoechoic fluid or blood seen within the endocervical
canal.  The posterior wall of the cervix appears enlarged with an
ill-defined hyperechoic area measuring approximately 2.9 x 4.8 by
2.9 cm.  A posterior cervical mass cannot be excluded.

Endometrium: Tiny amount of fluid seen in the fundal portion of
endometrial cavity.  Double layer thickness excluding the fluid
measures 9 mm transvaginally.  No focal lesion visualized.

Right ovary: 2.5 x 2.0 x 2.5 cm.  Normal appearance.  No adnexal
mass identified.

Left ovary: not directly visualized by transabdominal or
transvaginal sonography, however no adnexal mass identified.

Other Findings:  No free fluid
IMPRESSION: 1.  Endometrial thickness measures 9 mm transvaginally. In the
setting of post-menopausal bleeding, endometrial sampling is
indicated to exclude carcinoma.  If results are benign,
sonohysterogram should be considered for focal lesion work-up.
(Ref:  Radiological Reasoning: Algorithmic Workup of Abnormal
Vaginal Bleeding with Endovaginal Sonography and Sonohysterography.
AJR [id number]; 191:S68-73)
2. Question mass in the posterior cervical wall.  Cervical
carcinoma cannot be excluded by imaging.  Recommend clinical
correlation with pelvic exam, and consider pelvic MRI without and
with contrast for further evaluation if clinically warranted.
3.  Normal right ovary.  Nonvisualization of left ovary, however no
adnexal mass identified.

## 2012-10-05 IMAGING — CT CT HEAD W/O CM
2 series · 16 of 30 positions shown, 20 images · non-contrast
Comparison: None.

CLINICAL DATA: Blunt trauma.

CT HEAD WITHOUT CONTRAST
TECHNIQUE: Contiguous axial images were obtained from the base of
the skull through the vertex without contrast.

[Series 3: head w/o · axial · non-contrast · 0.49mm/px · z∈[+110,+240]mm · 13 of 32 slices shown, 17 images]
[im 3/32  brain]
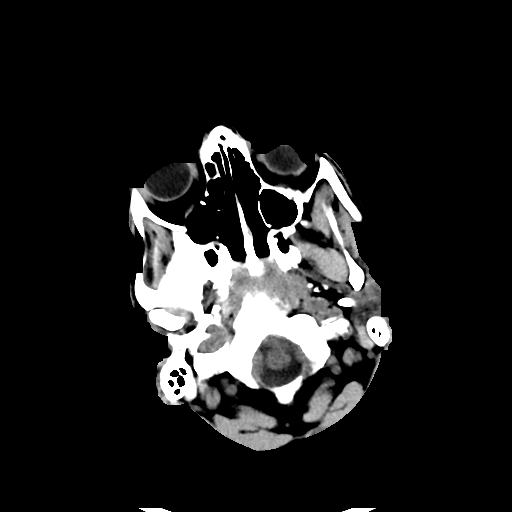
[im 3/32  bone]
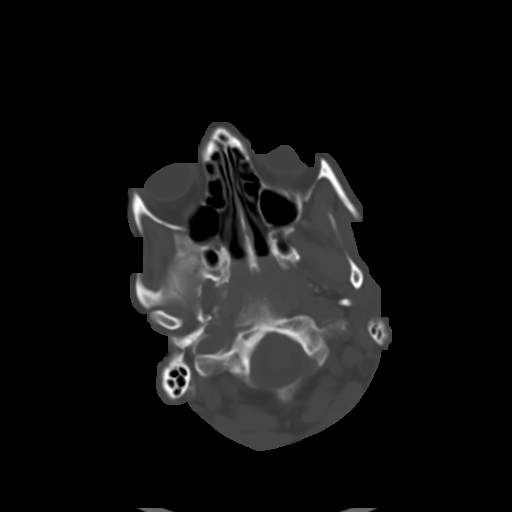
[im 5/32  brain]
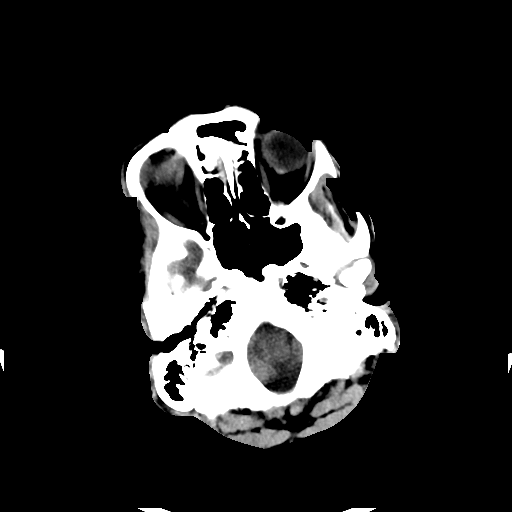
[im 7/32  brain]
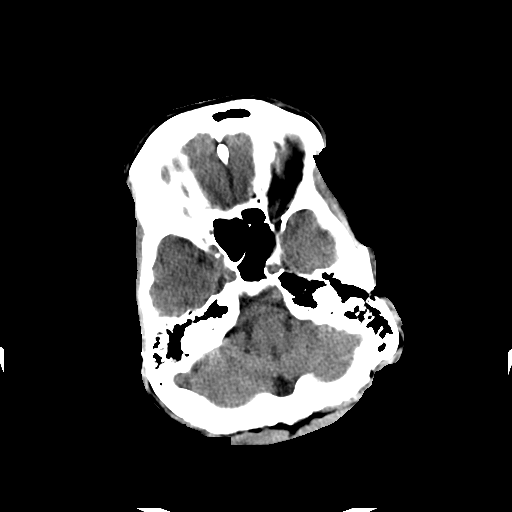
[im 9/32  brain]
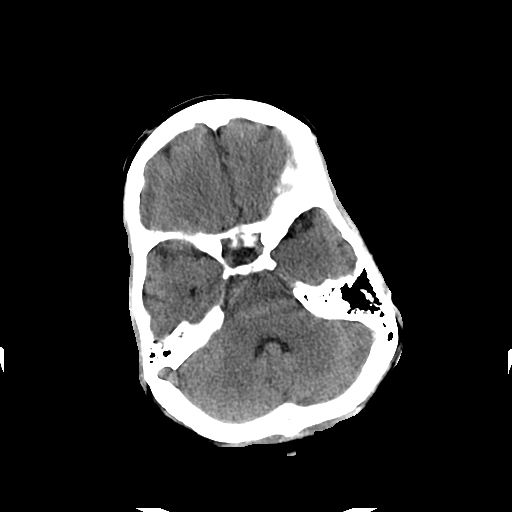
[im 12/32  brain]
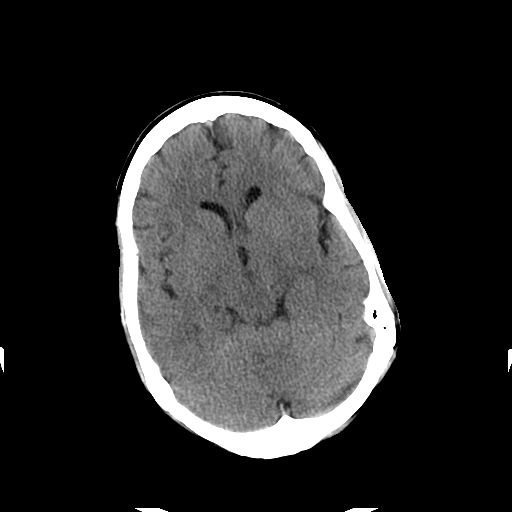
[im 12/32  bone]
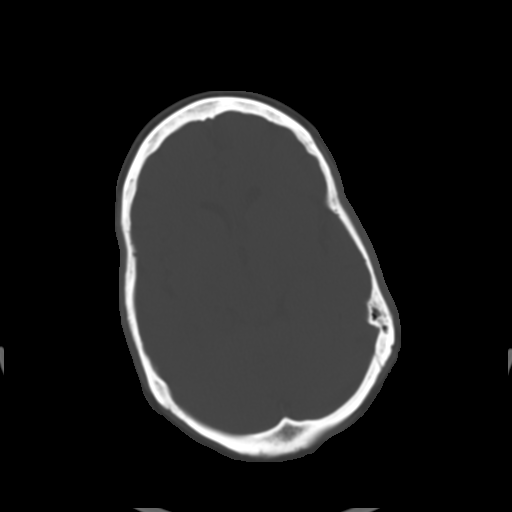
[im 14/32  brain]
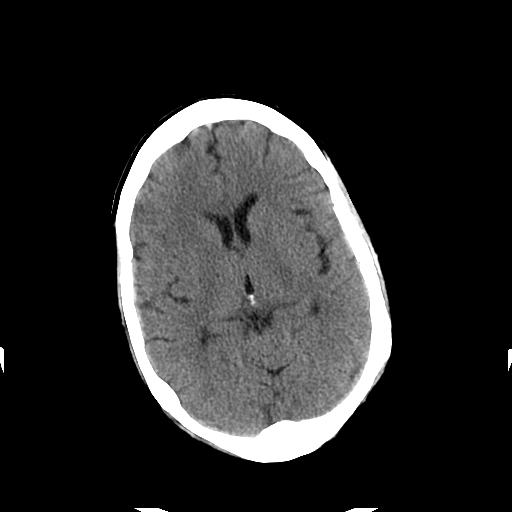
[im 16/32  brain]
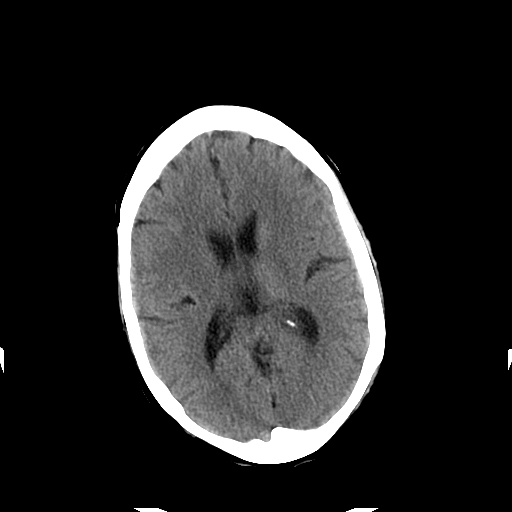
[im 18/32  brain]
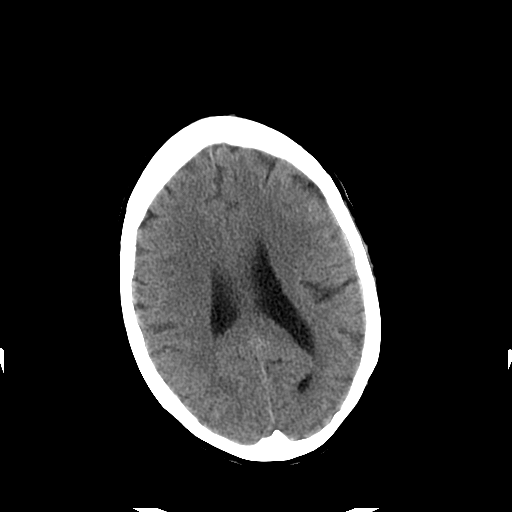
[im 20/32  brain]
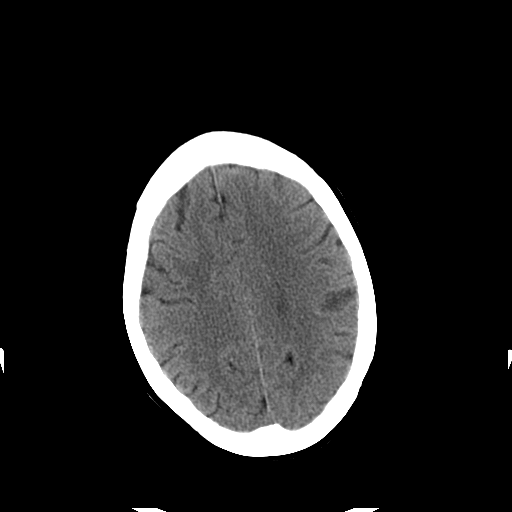
[im 20/32  bone]
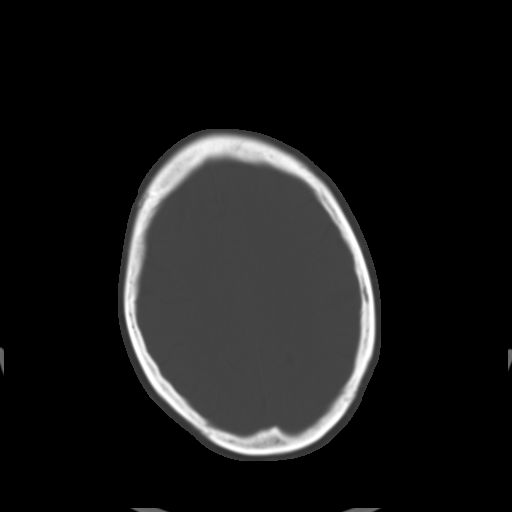
[im 23/32  brain]
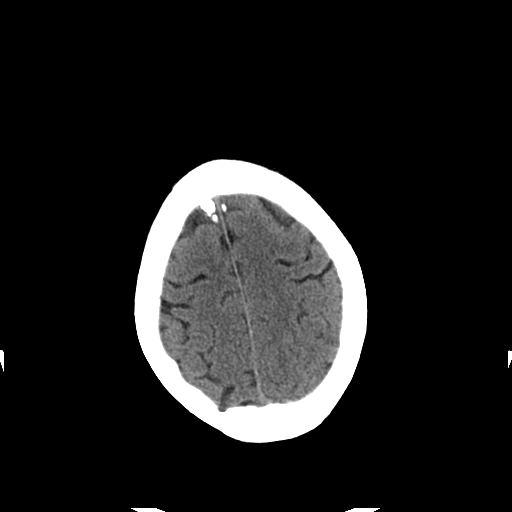
[im 25/32  brain]
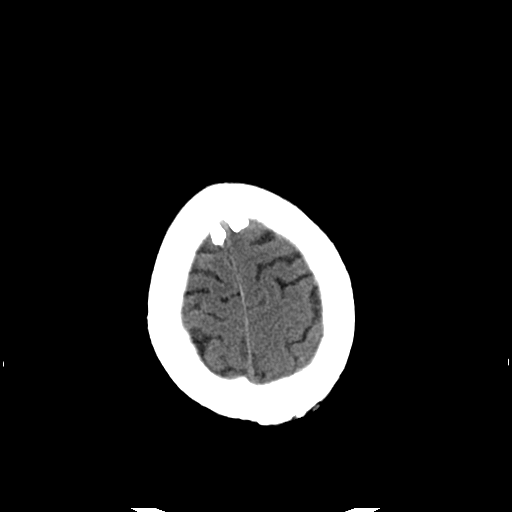
[im 27/32  brain]
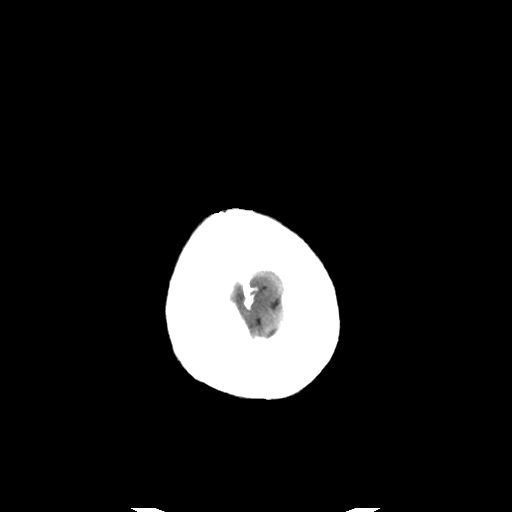
[im 29/32  brain]
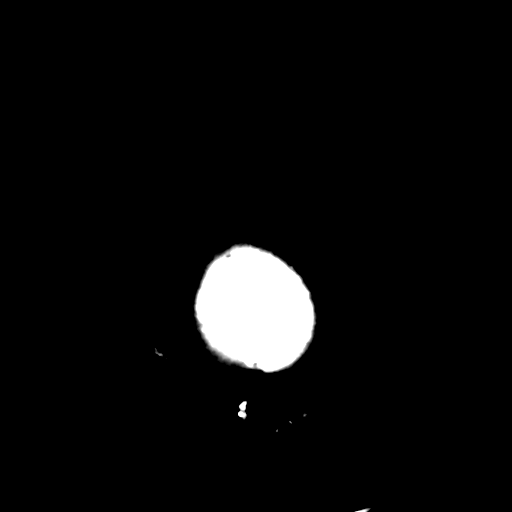
[im 29/32  bone]
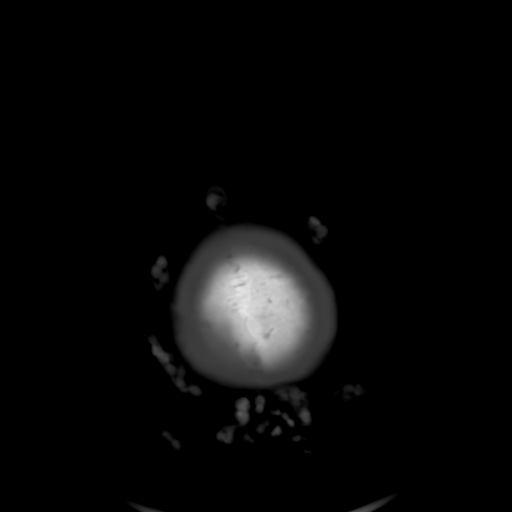

[Series 4: head w/o bone · axial · non-contrast · 0.49mm/px · z∈[+110,+155]mm · 3 of 32 slices shown]
[im 3/32  bone]
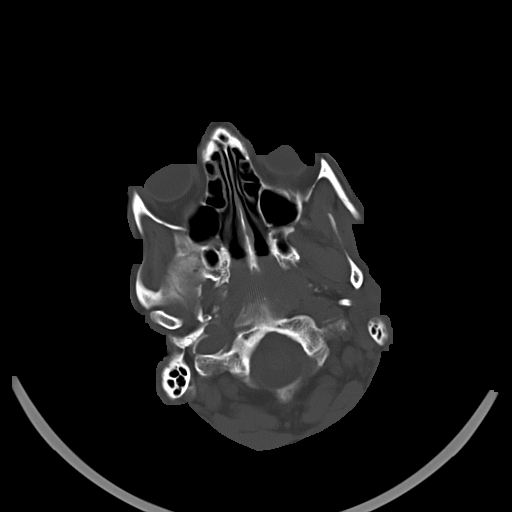
[im 7/32  bone]
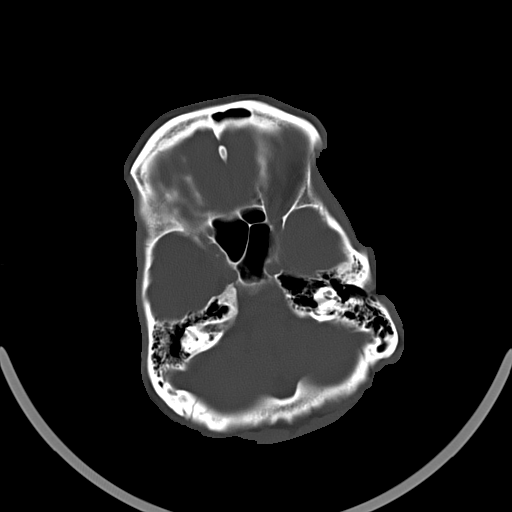
[im 12/32  bone]
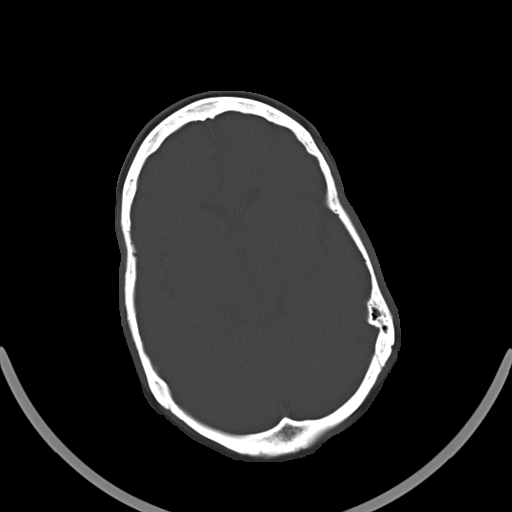

[16 of 30 positions shown; findings below may reference images not displayed]

FINDINGS: The ventricles and sulci are symmetrical without
significant effacement, displacement, or dilatation. No mass effect
or midline shift. No abnormal extra-axial fluid collections. The
grey-white matter junction is distinct. Basal cisterns are not
effaced. No acute intracranial hemorrhage. No depressed skull
fractures.  Visualized paranasal sinuses and mastoid air cells are
not opacified.
IMPRESSION: No acute intracranial abnormalities.

## 2013-08-12 ENCOUNTER — Encounter (HOSPITAL_COMMUNITY): Payer: Self-pay | Admitting: Emergency Medicine

## 2013-08-12 ENCOUNTER — Emergency Department (HOSPITAL_COMMUNITY)
Admission: EM | Admit: 2013-08-12 | Discharge: 2013-08-12 | Disposition: A | Discharge status: Home / Self Care | Payer: Medicaid Other | Attending: Emergency Medicine | Admitting: Emergency Medicine

## 2013-08-12 DIAGNOSIS — N949 Unspecified condition associated with female genital organs and menstrual cycle: Secondary | ICD-10-CM | POA: Insufficient documentation

## 2013-08-12 DIAGNOSIS — G8929 Other chronic pain: Secondary | ICD-10-CM | POA: Insufficient documentation

## 2013-08-12 DIAGNOSIS — Z8619 Personal history of other infectious and parasitic diseases: Secondary | ICD-10-CM | POA: Insufficient documentation

## 2013-08-12 DIAGNOSIS — Z8541 Personal history of malignant neoplasm of cervix uteri: Secondary | ICD-10-CM | POA: Insufficient documentation

## 2013-08-12 DIAGNOSIS — Z85528 Personal history of other malignant neoplasm of kidney: Secondary | ICD-10-CM | POA: Insufficient documentation

## 2013-08-12 DIAGNOSIS — Z8669 Personal history of other diseases of the nervous system and sense organs: Secondary | ICD-10-CM | POA: Insufficient documentation

## 2013-08-12 DIAGNOSIS — F17200 Nicotine dependence, unspecified, uncomplicated: Secondary | ICD-10-CM | POA: Insufficient documentation

## 2013-08-12 DIAGNOSIS — Z87448 Personal history of other diseases of urinary system: Secondary | ICD-10-CM | POA: Insufficient documentation

## 2013-08-12 DIAGNOSIS — K08109 Complete loss of teeth, unspecified cause, unspecified class: Secondary | ICD-10-CM | POA: Insufficient documentation

## 2013-08-12 DIAGNOSIS — Z21 Asymptomatic human immunodeficiency virus [HIV] infection status: Secondary | ICD-10-CM | POA: Insufficient documentation

## 2013-08-12 MED ORDER — ONDANSETRON HCL 4 MG/2ML IJ SOLN
4.0000 mg | Freq: Once | INTRAMUSCULAR | Status: AC
  Administered 2013-08-12: 4 mg via INTRAVENOUS
  Filled 2013-08-12: qty 2

## 2013-08-12 MED ORDER — HYDROMORPHONE HCL PF 2 MG/ML IJ SOLN
2.0000 mg | Freq: Once | INTRAMUSCULAR | Status: AC
  Administered 2013-08-12: 2 mg via INTRAVENOUS
  Filled 2013-08-12: qty 1

## 2013-08-12 MED ORDER — HYDROMORPHONE HCL PF 2 MG/ML IJ SOLN
2.0000 mg | Freq: Once | INTRAMUSCULAR | Status: AC
Start: 2013-08-12 — End: 2013-08-12
  Administered 2013-08-12: 2 mg via INTRAVENOUS
  Filled 2013-08-12: qty 1

## 2013-08-12 NOTE — ED Notes (Signed)
EMS report pt from [place] at [facility] in [place]. Pt is out of pain medication and has refills ready for pick up at pharmacy per hospice nurse.  Pt has hx of cervical cancer metastasized and is HIV positive. Only had 1 pain pill this morning. Normally uses oxycotin and oxycodone.

## 2013-08-12 NOTE — ED Notes (Signed)
Bed: WA02 Expected date:  Expected time:  Means of arrival:  Comments: EMS 61F [place] patient, hospice, needs medication refills

## 2013-08-12 NOTE — Discharge Instructions (Signed)
Abdominal Pain, Adult °Many things can cause abdominal pain. Usually, abdominal pain is not caused by a disease and will improve without treatment. It can often be observed and treated at home. Your health care provider will do a physical exam and possibly order blood tests and X-rays to help determine the seriousness of your pain. However, in many cases, more time must pass before a clear cause of the pain can be found. Before that point, your health care provider may not know if you need more testing or further treatment. °HOME CARE INSTRUCTIONS  °Monitor your abdominal pain for any changes. The following actions may help to alleviate any discomfort you are experiencing: °· Only take over-the-counter or prescription medicines as directed by your health care provider. °· Do not take laxatives unless directed to do so by your health care provider. °· Try a clear liquid diet (broth, tea, or water) as directed by your health care provider. Slowly move to a bland diet as tolerated. °SEEK MEDICAL CARE IF: °· You have unexplained abdominal pain. °· You have abdominal pain associated with nausea or diarrhea. °· You have pain when you urinate or have a bowel movement. °· You experience abdominal pain that wakes you in the night. °· You have abdominal pain that is worsened or improved by eating food. °· You have abdominal pain that is worsened with eating fatty foods. °SEEK IMMEDIATE MEDICAL CARE IF:  °· Your pain does not go away within 2 hours. °· You have a fever. °· You keep throwing up (vomiting). °· Your pain is felt only in portions of the abdomen, such as the right side or the left lower portion of the abdomen. °· You pass bloody or black tarry stools. °MAKE SURE YOU: °· Understand these instructions.   °· Will watch your condition.   °· Will get help right away if you are not doing well or get worse.   °Document Released: 02/26/2005 Document Revised: 03/09/2013 Document Reviewed: 01/26/2013 °[employer]® Patient  Information ©2014 [employer]. ° °Chronic Pain °Chronic pain can be defined as pain that is off and on and lasts for 3 6 months or longer. Many things cause chronic pain, which can make it difficult to make a diagnosis. There are many treatment options available for chronic pain. However, finding a treatment that works well for you may require trying various approaches until the right one is found. Many people benefit from a combination of two or more types of treatment to control their pain. °SYMPTOMS  °Chronic pain can occur anywhere in the body and can range from mild to very severe. Some types of chronic pain include: °· Headache. °· Low back pain. °· Cancer pain. °· Arthritis pain. °· Neurogenic pain. This is pain resulting from damage to nerves. ° People with chronic pain may also have other symptoms such as: °· Depression. °· Anger. °· Insomnia. °· Anxiety. °DIAGNOSIS  °Your health care provider will help diagnose your condition over time. In many cases, the initial focus will be on excluding possible conditions that could be causing the pain. Depending on your symptoms, your health care provider may order tests to diagnose your condition. Some of these tests may include:  °· Blood tests.   °· CT scan.   °· MRI.   °· X-rays.   °· Ultrasounds.   °· Nerve conduction studies.   °You may need to see a specialist.  °TREATMENT  °Finding treatment that works well may take time. You may be referred to a pain specialist. He or she may prescribe medicine or therapies,   such as:   Mindful meditation or yoga.  Shots (injections) of numbing or pain-relieving medicines into the spine or area of pain.  Local electrical stimulation.  Acupuncture.   Massage therapy.   Aroma, color, light, or sound therapy.   Biofeedback.   Working with a physical therapist to keep from getting stiff.   Regular, gentle exercise.   Cognitive or behavioral therapy.   Group support.  Sometimes, surgery may be  recommended.  HOME CARE INSTRUCTIONS   Take all medicines as directed by your health care provider.   Lessen stress in your life by relaxing and doing things such as listening to calming music.   Exercise or be active as directed by your health care provider.   Eat a healthy diet and include things such as vegetables, fruits, fish, and lean meats in your diet.   Keep all follow-up appointments with your health care provider.   Attend a support group with others suffering from chronic pain. SEEK MEDICAL CARE IF:   Your pain gets worse.   You develop a new pain that was not there before.   You cannot tolerate medicines given to you by your health care provider.   You have new symptoms since your last visit with your health care provider.  SEEK IMMEDIATE MEDICAL CARE IF:   You feel weak.   You have decreased sensation or numbness.   You lose control of bowel or bladder function.   Your pain suddenly gets much worse.   You develop shaking.  You develop chills.  You develop confusion.  You develop chest pain.  You develop shortness of breath.  MAKE SURE YOU:  Understand these instructions.  Will watch your condition.  Will get help right away if you are not doing well or get worse. Document Released: 02/08/2002 Document Revised: 01/19/2013 Document Reviewed: 11/12/2012 [facility] Patient Information 2014 [place].

## 2013-08-12 NOTE — ED Provider Notes (Signed)
Medical screening examination/treatment/procedure(s) were performed by non-physician practitioner and as supervising physician I was immediately available for consultation/collaboration.   EKG Interpretation None        Inayah Woodin H Gwynevere Lizana, MD 08/12/13 2334 

## 2013-08-12 NOTE — ED Provider Notes (Signed)
CSN: [id number]     Arrival date & time 08/12/13  2029 History   First MD Initiated Contact with Patient 08/12/13 2035     Chief Complaint  Patient presents with  . Generalized Body Aches     (Consider location/radiation/quality/duration/timing/severity/associated sxs/prior Treatment) HPI Comments: Patient is 63 year old female with history of AIDS, neuropathy, Cervical cancer who is followed by Hospice for pain control.  She states that she normally takes oxycontin 30 mg 2 tablet twice daily and oxycodone 15 mg every 3 hours for pain.  Patient states she ran out of her pain medication after her first dose this morning.  She reports pain in the pelvic area with radiates into her vagina.  She states this is her normal pain and that she just needs some IV pain medication until her family fills her prescription.  We have spoken with her hospice nurse who states that her refill was called into the pharmacy today and that it is ready for pickup.  We have spoken with the pharmacy who states that the medication was just picked up by the patient's son.  She denies fever, chills, anorexia, nausea, vomiting, worsening of her chronic pain, constipation or diarrhea.  The history is provided by the patient and a caregiver. No language interpreter was used.    Past Medical History  Diagnosis Date  . AIDS   . Neuropathy   . Herpes   . Toxoplasmosis   . HIV disease   . Herpes   . Neuropathy   . Kidney mass   . Hepatitis C virus   . Vaginal venereal warts   . Cervical cancer   . Renal cell carcinoma     left kidney; presumable   History reviewed. No pertinent past surgical history. Family History  Problem Relation Age of Onset  . Hypertension Maternal Grandmother   . Cancer Maternal Grandmother     lung ca  . Stroke Maternal Grandmother    History  Substance Use Topics  . Smoking status: Current Every Day Smoker -- 0.50 packs/day    Types: Cigarettes  . Smokeless tobacco: Never Used  .  Alcohol Use: No   OB History   Grav Para Term Preterm Abortions TAB SAB Ect Mult Living                 Review of Systems  All other systems reviewed and are negative.      Allergies  Adhesive; Aspirin; and Pork-derived products  Home Medications   Current Outpatient Rx  Name  Route  Sig  Dispense  Refill  . ibuprofen (ADVIL,MOTRIN) 200 MG tablet   Oral   Take 400 mg by mouth every 6 (six) hours as needed for fever or moderate pain.         Marland Kitchen oxyCODONE (ROXICODONE) 15 MG immediate release tablet   Oral   Take 30 mg by mouth every 3 (three) hours as needed for pain.         . OxyCODONE HCl ER (OXYCONTIN) 30 MG T12A   Oral   Take 60 mg by mouth every 12 (twelve) hours.          BP 148/103  Pulse 90  Temp(Src) 98.9 F (37.2 C) (Oral)  Resp 18  SpO2 96% Physical Exam  Nursing note and vitals reviewed. Constitutional: She is oriented to person, place, and time. She appears well-developed and well-nourished. No distress.  HENT:  Head: Normocephalic and atraumatic.  Right Ear: External ear normal.  Left Ear: External ear normal.  Nose: Nose normal.  Mouth/Throat: Oropharynx is clear and moist. No oropharyngeal exudate.  endentulous  Eyes: Conjunctivae are normal. Pupils are equal, round, and reactive to light. No scleral icterus.  Neck: Normal range of motion. Neck supple.  Cardiovascular: Normal rate, regular rhythm and normal heart sounds.  Exam reveals no gallop and no friction rub.   No murmur heard. Pulmonary/Chest: Effort normal and breath sounds normal. No respiratory distress. She has no wheezes. She has no rales. She exhibits no tenderness.  Abdominal: Soft. Bowel sounds are normal. She exhibits no distension. There is tenderness. There is no rebound and no guarding.    Musculoskeletal: Normal range of motion. She exhibits no edema and no tenderness.  Lymphadenopathy:    She has no cervical adenopathy.  Neurological: She is alert and oriented to  person, place, and time. She exhibits normal muscle tone. Coordination normal.  Skin: Skin is warm and dry. No rash noted. No erythema. No pallor.  Psychiatric: She has a normal mood and affect. Her behavior is normal. Judgment and thought content normal.    ED Course  Procedures (including critical care time) Labs Review Labs Reviewed - No data to display Imaging Review No results found.   EKG Interpretation None      Medications  HYDROmorphone (DILAUDID) injection 2 mg (not administered)  HYDROmorphone (DILAUDID) injection 2 mg (2 mg Intravenous Given 08/12/13 2110)  ondansetron (ZOFRAN) injection 4 mg (4 mg Intravenous Given 08/12/13 2110)  HYDROmorphone (DILAUDID) injection 2 mg (2 mg Intravenous Given 08/12/13 2149)  ondansetron (ZOFRAN) injection 4 mg (4 mg Intravenous Given 08/12/13 2149)      MDM   Chronic pain  Patient here initially for refill of chronic pain medication but now will have these medications.  I have treated her pain here with dilaudid 6mg  IV with good pain control.  Patient will be discharged home to follow up with PCP and hospice, as there are no other complaints, I do not feel that labs or x-rays are needed on this patient.    Idalia Needle Joelyn Oms, Hershal Coria 08/12/13 2159

## 2013-09-09 ENCOUNTER — Emergency Department (HOSPITAL_COMMUNITY): Payer: Medicaid Other

## 2013-09-09 ENCOUNTER — Emergency Department (HOSPITAL_COMMUNITY)
Admission: EM | Admit: 2013-09-09 | Discharge: 2013-09-09 | Disposition: A | Discharge status: Home / Self Care | Payer: Medicaid Other | Attending: Emergency Medicine | Admitting: Emergency Medicine

## 2013-09-09 ENCOUNTER — Encounter (HOSPITAL_COMMUNITY): Payer: Self-pay | Admitting: Emergency Medicine

## 2013-09-09 DIAGNOSIS — K5900 Constipation, unspecified: Secondary | ICD-10-CM | POA: Insufficient documentation

## 2013-09-09 DIAGNOSIS — N898 Other specified noninflammatory disorders of vagina: Secondary | ICD-10-CM | POA: Insufficient documentation

## 2013-09-09 DIAGNOSIS — Z85528 Personal history of other malignant neoplasm of kidney: Secondary | ICD-10-CM | POA: Insufficient documentation

## 2013-09-09 DIAGNOSIS — Z8541 Personal history of malignant neoplasm of cervix uteri: Secondary | ICD-10-CM | POA: Insufficient documentation

## 2013-09-09 DIAGNOSIS — Z87448 Personal history of other diseases of urinary system: Secondary | ICD-10-CM | POA: Insufficient documentation

## 2013-09-09 DIAGNOSIS — G8929 Other chronic pain: Secondary | ICD-10-CM | POA: Insufficient documentation

## 2013-09-09 DIAGNOSIS — F17200 Nicotine dependence, unspecified, uncomplicated: Secondary | ICD-10-CM | POA: Insufficient documentation

## 2013-09-09 DIAGNOSIS — R5381 Other malaise: Secondary | ICD-10-CM | POA: Insufficient documentation

## 2013-09-09 DIAGNOSIS — Z515 Encounter for palliative care: Secondary | ICD-10-CM | POA: Insufficient documentation

## 2013-09-09 DIAGNOSIS — R0602 Shortness of breath: Secondary | ICD-10-CM | POA: Insufficient documentation

## 2013-09-09 DIAGNOSIS — C7800 Secondary malignant neoplasm of unspecified lung: Secondary | ICD-10-CM | POA: Insufficient documentation

## 2013-09-09 DIAGNOSIS — R34 Anuria and oliguria: Secondary | ICD-10-CM | POA: Insufficient documentation

## 2013-09-09 DIAGNOSIS — B20 Human immunodeficiency virus [HIV] disease: Secondary | ICD-10-CM | POA: Insufficient documentation

## 2013-09-09 DIAGNOSIS — IMO0002 Reserved for concepts with insufficient information to code with codable children: Secondary | ICD-10-CM | POA: Insufficient documentation

## 2013-09-09 DIAGNOSIS — Z8619 Personal history of other infectious and parasitic diseases: Secondary | ICD-10-CM | POA: Insufficient documentation

## 2013-09-09 DIAGNOSIS — N949 Unspecified condition associated with female genital organs and menstrual cycle: Secondary | ICD-10-CM | POA: Insufficient documentation

## 2013-09-09 DIAGNOSIS — R5383 Other fatigue: Secondary | ICD-10-CM

## 2013-09-09 DIAGNOSIS — R52 Pain, unspecified: Secondary | ICD-10-CM | POA: Insufficient documentation

## 2013-09-09 DIAGNOSIS — R110 Nausea: Secondary | ICD-10-CM | POA: Insufficient documentation

## 2013-09-09 LAB — CBC WITH DIFFERENTIAL/PLATELET
BASOS ABS: 0 10*3/uL (ref 0.0–0.1)
Basophils Relative: 0 % (ref 0–1)
EOS ABS: 0.4 10*3/uL (ref 0.0–0.7)
Eosinophils Relative: 8 % — ABNORMAL HIGH (ref 0–5)
HCT: 28.6 % — ABNORMAL LOW (ref 36.0–46.0)
Hemoglobin: 9.2 g/dL — ABNORMAL LOW (ref 12.0–15.0)
LYMPHS PCT: 16 % (ref 12–46)
Lymphs Abs: 0.8 10*3/uL (ref 0.7–4.0)
MCH: 21.9 pg — AB (ref 26.0–34.0)
MCHC: 32.2 g/dL (ref 30.0–36.0)
MCV: 67.9 fL — ABNORMAL LOW (ref 78.0–100.0)
MONO ABS: 0.5 10*3/uL (ref 0.1–1.0)
Monocytes Relative: 10 % (ref 3–12)
NEUTROS PCT: 66 % (ref 43–77)
Neutro Abs: 3.6 10*3/uL (ref 1.7–7.7)
PLATELETS: ADEQUATE 10*3/uL (ref 150–400)
RBC: 4.21 MIL/uL (ref 3.87–5.11)
RDW: 18.9 % — ABNORMAL HIGH (ref 11.5–15.5)
WBC: 5.3 10*3/uL (ref 4.0–10.5)

## 2013-09-09 LAB — COMPREHENSIVE METABOLIC PANEL
ALBUMIN: 2.4 g/dL — AB (ref 3.5–5.2)
ALT: 6 U/L (ref 0–35)
AST: 17 U/L (ref 0–37)
Alkaline Phosphatase: 77 U/L (ref 39–117)
BUN: 11 mg/dL (ref 6–23)
CALCIUM: 8.8 mg/dL (ref 8.4–10.5)
CO2: 23 mEq/L (ref 19–32)
Chloride: 106 mEq/L (ref 96–112)
Creatinine, Ser: 0.91 mg/dL (ref 0.50–1.10)
GFR calc non Af Amer: 65 mL/min — ABNORMAL LOW (ref >90)
GFR, EST AFRICAN AMERICAN: 76 mL/min — AB (ref >90)
GLUCOSE: 68 mg/dL — AB (ref 70–99)
POTASSIUM: 4.4 meq/L (ref 3.7–5.3)
SODIUM: 142 meq/L (ref 137–147)
TOTAL PROTEIN: 7.7 g/dL (ref 6.0–8.3)
Total Bilirubin: <0.2 mg/dL — ABNORMAL LOW (ref 0.3–1.2)

## 2013-09-09 LAB — WET PREP, GENITAL
CLUE CELLS WET PREP: NONE SEEN
Trich, Wet Prep: NONE SEEN
WBC WET PREP: NONE SEEN
Yeast Wet Prep HPF POC: NONE SEEN

## 2013-09-09 LAB — URINALYSIS, ROUTINE W REFLEX MICROSCOPIC
Bilirubin Urine: NEGATIVE
Glucose, UA: NEGATIVE mg/dL
Hgb urine dipstick: NEGATIVE
KETONES UR: NEGATIVE mg/dL
Leukocytes, UA: NEGATIVE
Nitrite: NEGATIVE
Protein, ur: NEGATIVE mg/dL
Specific Gravity, Urine: 1.014 (ref 1.005–1.030)
Urobilinogen, UA: 0.2 mg/dL (ref 0.0–1.0)
pH: 6 (ref 5.0–8.0)

## 2013-09-09 LAB — TROPONIN I: Troponin I: <0.3 ng/mL (ref <0.30)

## 2013-09-09 MED ORDER — HYDROMORPHONE HCL PF 1 MG/ML IJ SOLN
1.0000 mg | Freq: Once | INTRAMUSCULAR | Status: AC
  Administered 2013-09-09: 1 mg via INTRAVENOUS
  Filled 2013-09-09: qty 1

## 2013-09-09 MED ORDER — SODIUM CHLORIDE 0.9 % IV SOLN
INTRAVENOUS | Status: DC
  Administered 2013-09-09: 16:00:00 via INTRAVENOUS

## 2013-09-09 MED ORDER — SODIUM CHLORIDE 0.9 % IV SOLN: INTRAVENOUS | Status: DC

## 2013-09-09 MED ORDER — SODIUM CHLORIDE 0.9 % IV BOLUS (SEPSIS)
500.0000 mL | Freq: Once | INTRAVENOUS | Status: AC
  Administered 2013-09-09: 500 mL via INTRAVENOUS

## 2013-09-09 MED ORDER — LEVOFLOXACIN 250 MG PO TABS: 750.0000 mg | ORAL_TABLET | Freq: Every day | ORAL | Status: DC

## 2013-09-09 MED ORDER — HYDROMORPHONE HCL PF 2 MG/ML IJ SOLN: 2.0000 mg | Freq: Once | INTRAMUSCULAR | Status: DC

## 2013-09-09 MED ORDER — IOHEXOL 300 MG/ML  SOLN: 80.0000 mL | Freq: Once | INTRAMUSCULAR | Status: DC | PRN

## 2013-09-09 MED ORDER — IOHEXOL 300 MG/ML  SOLN
80.0000 mL | Freq: Once | INTRAMUSCULAR | Status: AC | PRN
  Administered 2013-09-09: 80 mL via INTRAVENOUS

## 2013-09-09 NOTE — ED Notes (Signed)
PA at bedside.

## 2013-09-09 NOTE — ED Provider Notes (Signed)
CSN: [id number]     Arrival date & time 09/09/13  1425 History   First MD Initiated Contact with Patient 09/09/13 1507     Chief Complaint  Patient presents with  . Generalized Pain      (Consider location/radiation/quality/duration/timing/severity/associated sxs/prior Treatment) The history is provided by the patient and a relative. No language interpreter was used.  Christine Vincent is a 63 y/o F with PMHx of cervical cancer, renal cell carcinoma currently on Hospice care recently taking Oxycodone 15 mg every 3 hours as needed for pain and Oxycontin 60 mg every 12 hours for pain presenting to the ED with worsening abdominal/vaginal pain for the past 2 days. Patient reported that she is unable to explain the pain - reported that it is localized to her vagina, cervix, back, groin and bilateral legs. Reported that she has been taking Oxycodone and Oxycontin without relief. Patient reported that her main physicians are at [facility], but comes to [facility] because it is closer to her house. Patient reported that she has been noticing vaginal spotting for one week mainly pinkish in color. Patient reported that she has been noticing mild vaginal discharge over the past couple of days. Reported that she can barley urinate, reported no BM for the past week only a small amount this morning at 2:00AM. Patient reported that she has difficulty swallowing and that she has been having shortness of breath and difficulty breathing. Denied chest pain, melena, hematochezia, fever, chills, dizziness, fainting.  PCP Dr. Teryl Lucy  Oncologist Dr. Jacqualine Mau  Past Medical History  Diagnosis Date  . AIDS   . Neuropathy   . Herpes   . Toxoplasmosis   . HIV disease   . Herpes   . Neuropathy   . Kidney mass   . Hepatitis C virus   . Vaginal venereal warts   . Cervical cancer   . Renal cell carcinoma     left kidney; presumable   History reviewed. No pertinent past surgical history. Family History  Problem  Relation Age of Onset  . Hypertension Maternal Grandmother   . Cancer Maternal Grandmother     lung ca  . Stroke Maternal Grandmother    History  Substance Use Topics  . Smoking status: Current Every Day Smoker -- 0.50 packs/day    Types: Cigarettes  . Smokeless tobacco: Never Used  . Alcohol Use: No   OB History   Grav Para Term Preterm Abortions TAB SAB Ect Mult Living                 Review of Systems  Constitutional: Negative for fever and chills.  Respiratory: Positive for shortness of breath. Negative for chest tightness.   Cardiovascular: Negative for chest pain.  Gastrointestinal: Positive for nausea, abdominal pain and constipation. Negative for diarrhea, blood in stool and anal bleeding.  Genitourinary: Positive for decreased urine volume, vaginal bleeding, vaginal discharge, vaginal pain and pelvic pain. Negative for dysuria and flank pain.  Neurological: Positive for weakness.  All other systems reviewed and are negative.     Allergies  Adhesive; Aspirin; and Pork-derived products  Home Medications   Current Outpatient Rx  Name  Route  Sig  Dispense  Refill  . ondansetron (ZOFRAN) 4 MG tablet   Oral   Take 4 mg by mouth every 8 (eight) hours as needed for nausea or vomiting.         Marland Kitchen oxyCODONE (ROXICODONE) 15 MG immediate release tablet   Oral   Take  30 mg by mouth every 3 (three) hours as needed for pain.         . OxyCODONE HCl ER (OXYCONTIN) 60 MG T12A   Oral   Take 60 mg by mouth every 12 (twelve) hours.         . traMADol (ULTRAM) 50 MG tablet   Oral   Take 50 mg by mouth every 6 (six) hours as needed (for headache).          Marland Kitchen levofloxacin (LEVAQUIN) 250 MG tablet   Oral   Take 3 tablets (750 mg total) by mouth daily.   7 tablet   0    BP 154/99  Pulse 72  Temp(Src) 98.6 F (37 C) (Oral)  Resp 16  SpO2 97% Physical Exam  Nursing note and vitals reviewed. Constitutional: She is oriented to person, place, and time. She  appears well-developed. No distress.  Frail elderly woman  HENT:  Head: Normocephalic and atraumatic.  Mouth/Throat: Oropharynx is clear and moist. No oropharyngeal exudate.  Eyes: EOM are normal. Pupils are equal, round, and reactive to light. Right eye exhibits no discharge. Left eye exhibits no discharge.  Neck: Normal range of motion. Neck supple. No tracheal deviation present.  Negative neck stiffness Negative nuchal rigidity Negative cervical lymphadenopathy  Cardiovascular: Normal rate, regular rhythm and normal heart sounds.  Exam reveals no friction rub.   No murmur heard. Pulses:      Radial pulses are 2+ on the right side, and 2+ on the left side.       Dorsalis pedis pulses are 2+ on the right side, and 2+ on the left side.  Pulmonary/Chest: Effort normal and breath sounds normal. No respiratory distress. She has no wheezes. She has no rales.  Negative signs of respiratory distress Negative use of accessory muscles Negative stridor Patient is able to speak in full sentences without difficulty   Abdominal: Soft. Bowel sounds are normal. There is tenderness. There is guarding.  Negative abdominal distension Discomfort upon palpation to the lower abdominal region - suprapubic mainly  Negative Murphy's sign  Negative McBurney's point   Genitourinary:  Pelvic Exam: Negative swelling, erythema, inflammation, lesions, sores noted to the external genitalia. Negative swelling noted to the vaginal wall - negative lesions, sores, masses noted. Cervix identified with bright red blood, scant with thick white discharge - negative signs of hemorrhage. Suprapubic discomfort with bilateral adnexal tenderness noted.  Exam chaperone with nurse.   Musculoskeletal: Normal range of motion.  Full ROM to upper and lower extremities without difficulty noted, negative ataxia noted.  Lymphadenopathy:    She has no cervical adenopathy.  Neurological: She is alert and oriented to person, place, and  time. No cranial nerve deficit. She exhibits normal muscle tone. Coordination normal.  Skin: Skin is warm and dry. No rash noted. She is not diaphoretic. No erythema.  Psychiatric:  Patient appears agitated    ED Course  Procedures (including critical care time)  9:07 PM This provider had a long discussion with the patient regarding labs and imaging. 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 Patient reported that she was never given the option of hysterectomy. Patient reports that she does not have pain medications at home. Patient reported that she has a hospice nurse that is due to come to the home, reported that the hospice nurse did not come to the home yesterday or today-patient appears upset about this.  9:41 PM This provider spoke with [facility]  hospice care nurse Tiffany-discussed if patient has pain medications at home. Nurse reported that she will try getting content with patient's primary nurse. Today reported that she will contact this provider again.  9:47 PM This provider he CD5 call from Tiffany-nurse from [facility]. As per nurse, reported that patient is going to be discharged from hospice care on 09/12/2013 secondary to patient refusing to see nurse that comes to the patient's house. Reported that patient has a significant history of drug seeking and drug abuse-Tiffany reported that there are a lot and notes that the patient going from pharmacy the pharmacy with recurrent refills of oxycodone medications. Primary nurse spoke with Tiffany and reported that patient has not been seen by her primary nurse in over a month. Nurse reported the patient's medications were filled on 08/25/2013-reported that patient's physician Dr. Baldemar Lenis filled patient's medications at [facility] on [address] - reported that approximately 200 tablets of oxycodone 15 mg IR and 60 mg OxyContin 30 tablet given to the patient at this time. Nurse reported that patient should  have plenty of pain medications left at home.  Results for orders placed during the hospital encounter of 09/09/13  WET PREP, GENITAL      Result Value Ref Range   Yeast Wet Prep HPF POC NONE SEEN  NONE SEEN   Trich, Wet Prep NONE SEEN  NONE SEEN   Clue Cells Wet Prep HPF POC NONE SEEN  NONE SEEN   WBC, Wet Prep HPF POC NONE SEEN  NONE SEEN  CBC WITH DIFFERENTIAL      Result Value Ref Range   WBC 5.3  4.0 - 10.5 K/uL   RBC 4.21  3.87 - 5.11 MIL/uL   Hemoglobin 9.2 (*) 12.0 - 15.0 g/dL   HCT 16.1 (*) 09.6 - 04.5 %   MCV 67.9 (*) 78.0 - 100.0 fL   MCH 21.9 (*) 26.0 - 34.0 pg   MCHC 32.2  30.0 - 36.0 g/dL   RDW 40.9 (*) 81.1 - 91.4 %   Platelets    150 - 400 K/uL   Value: PLATELET CLUMPS NOTED ON SMEAR, COUNT APPEARS ADEQUATE   Neutrophils Relative % 66  43 - 77 %   Lymphocytes Relative 16  12 - 46 %   Monocytes Relative 10  3 - 12 %   Eosinophils Relative 8 (*) 0 - 5 %   Basophils Relative 0  0 - 1 %   Neutro Abs 3.6  1.7 - 7.7 K/uL   Lymphs Abs 0.8  0.7 - 4.0 K/uL   Monocytes Absolute 0.5  0.1 - 1.0 K/uL   Eosinophils Absolute 0.4  0.0 - 0.7 K/uL   Basophils Absolute 0.0  0.0 - 0.1 K/uL   Smear Review MORPHOLOGY UNREMARKABLE    COMPREHENSIVE METABOLIC PANEL      Result Value Ref Range   Sodium 142  137 - 147 mEq/L   Potassium 4.4  3.7 - 5.3 mEq/L   Chloride 106  96 - 112 mEq/L   CO2 23  19 - 32 mEq/L   Glucose, Bld 68 (*) 70 - 99 mg/dL   BUN 11  6 - 23 mg/dL   Creatinine, Ser 7.82  0.50 - 1.10 mg/dL   Calcium 8.8  8.4 - 95.6 mg/dL   Total Protein 7.7  6.0 - 8.3 g/dL   Albumin 2.4 (*) 3.5 - 5.2 g/dL   AST 17  0 - 37 U/L   ALT 6  0 - 35  U/L   Alkaline Phosphatase 77  39 - 117 U/L   Total Bilirubin <0.2 (*) 0.3 - 1.2 mg/dL   GFR calc non Af Amer 65 (*) >90 mL/min   GFR calc Af Amer 76 (*) >90 mL/min  URINALYSIS, ROUTINE W REFLEX MICROSCOPIC      Result Value Ref Range   Color, Urine YELLOW  YELLOW   APPearance CLOUDY (*) CLEAR   Specific Gravity, Urine 1.014  1.005 -  1.030   pH 6.0  5.0 - 8.0   Glucose, UA NEGATIVE  NEGATIVE mg/dL   Hgb urine dipstick NEGATIVE  NEGATIVE   Bilirubin Urine NEGATIVE  NEGATIVE   Ketones, ur NEGATIVE  NEGATIVE mg/dL   Protein, ur NEGATIVE  NEGATIVE mg/dL   Urobilinogen, UA 0.2  0.0 - 1.0 mg/dL   Nitrite NEGATIVE  NEGATIVE   Leukocytes, UA NEGATIVE  NEGATIVE  TROPONIN I      Result Value Ref Range   Troponin I <0.30  <0.30 ng/mL    Labs Review Labs Reviewed  CBC WITH DIFFERENTIAL - Abnormal; Notable for the following:    Hemoglobin 9.2 (*)    HCT 28.6 (*)    MCV 67.9 (*)    MCH 21.9 (*)    RDW 18.9 (*)    Eosinophils Relative 8 (*)    All other components within normal limits  COMPREHENSIVE METABOLIC PANEL - Abnormal; Notable for the following:    Glucose, Bld 68 (*)    Albumin 2.4 (*)    Total Bilirubin <0.2 (*)    GFR calc non Af Amer 65 (*)    GFR calc Af Amer 76 (*)    All other components within normal limits  URINALYSIS, ROUTINE W REFLEX MICROSCOPIC - Abnormal; Notable for the following:    APPearance CLOUDY (*)    All other components within normal limits  WET PREP, GENITAL  GC/CHLAMYDIA PROBE AMP  TROPONIN I   Imaging Review Dg Chest 2 View  09/09/2013   CLINICAL DATA:  Shortness of breath, history of smoking  EXAM: CHEST  2 VIEW  COMPARISON:  DG CHEST 2 VIEW dated 08/19/2012  FINDINGS: Grossly unchanged cardiac silhouette and mediastinal contours with slight differences attributable to decreased lung volumes. The lungs remain hyperexpanded. There is an apparent development of an approximately 1.8 x 2.1 cm nodule within the left lower lobe, best seen on the provided lateral radiograph. Worsening bibasilar heterogeneous opacities, left greater than right. No pleural effusion or pneumothorax. No evidence edema. No acute osseus abnormalities.  IMPRESSION: 1. Emphysematous change with development of an approximately 2.1 cm nodule overlying the left lower lung. Further evaluation with chest CT is recommended.  2. Decreased lung volumes with worsening bibasilar opacities, left greater than right, atelectasis versus infiltrate.   Electronically Signed   By: Sandi Mariscal M.D.   On: 09/09/2013 17:03   Ct Abdomen Pelvis W Contrast  09/09/2013   CLINICAL DATA:  Generalized body pain, chronic, due to history of cancer, out of pain meds  EXAM: CT ABDOMEN AND PELVIS WITH CONTRAST  TECHNIQUE: Multidetector CT imaging of the abdomen and pelvis was performed using the standard protocol following bolus administration of intravenous contrast.  CONTRAST:  45mL OMNIPAQUE IOHEXOL 300 MG/ML  SOLN  COMPARISON:  US TRANSVAGINAL NON-OB dated 01/14/2012  FINDINGS: 14 x 15 mm nodule left lung base. Small pericardial effusion measuring up to 1 cm. 6 mm sclerotic lesion right L5 vertebral body, possibly a bone island versus small sclerotic metastasis.  At  least 7 liver lesions involving both hepatic lobes. Largest lesion is in the lateral left lobe measuring 21 x 22 mm and shows an appearance suggesting possible hemangioma. Further inferiorly, on image number 24, medial left lobe lesion measuring 10 x 16 mm may also represent a hemangioma. However, there are lesions in the right lobe, measuring 14 x 15 mm, 16 x 15 mm, 5 mm, and 25 x 24 mm which do not appear consistent with hemangiomas. There is a lesion in the lateral left lobe of the liver inferiorly measuring 18 x 10 mm which does not clearly represent a hemangioma, nor does a 14 x 9 mm lesion more superiorly in the lateral left lobe.  Gallbladder is normal. Spleen is normal. Pancreas is normal. Adrenal glands are normal. Aorta is normal except for calcification distally at the bifurcation extending into both iliac arteries.  Right kidney is normal. There is severe left hydronephrosis. Left renal pelvis is dilated to 3.4 cm. The left ureter is severely dilated well into the pelvis, terminating abruptly in the region of the left inferior at adnexa approximately 5 cm proximal to the  ureterovesical junction. There is a left renal mass measuring 19 x 27 mm, demonstrating low-attenuation diffusely with a focus of higher attenuation, possibly enhancement, within its more posterior aspect.  There is a nonobstructing bowel gas pattern. The distal sigmoid colon and rectum are poorly defined, and there is evidence of rectal wall thickening. The uterus is 61 x 63 x 95 mm, lobulated, and heterogeneously enhancing. There is particular heterogeneity and lobulation inferiorly. There appears to be fluid in the upper endometrial canal. Bladder is normal.  There is no significant adenopathy.  IMPRESSION: 1. Left lung nodule concerning for metastasis 2. Multiple liver lesions, some of which may represent hemangiomas, but the majority of which appear suspicious for metastasis. 3. Severe left hydronephrosis. Ureteral obstruction appears to occur in the region of the inferior left adnexa. This could be related to direct involvement of present tumor involving uterus or cervix. If the patient has undergone radiation therapy, fibrosis could be considered. 4. Left renal mass concerning for metastasis or primary neoplasia less likely 5. Enlarged heterogeneous and lobulated uterus. Findings concerning for endometrial or cervical carcinoma. 6. Evidence of rectal wall thickening, with poor definition of the distal sigmoid colon and rectum. This could be due to direct involvement of tumor, or possibly due to radiation therapy. Correlate clinically. 7. Small indeterminate L5 sclerotic lesion.   Electronically Signed   By: Skipper Cliche M.D.   On: 09/09/2013 20:29     EKG Interpretation   Date/Time:  Friday September 09 2013 16:07:53 EDT Ventricular Rate:  86 PR Interval:  117 QRS Duration: 115 QT Interval:  389 QTC Calculation: 465 R Axis:   60 Text Interpretation:  Sinus rhythm Borderline short PR interval  Nonspecific intraventricular conduction delay Confirmed by Christy Gentles  MD,  DONALD ([id number]) on 09/09/2013  4:47:11 PM      MDM   Final diagnoses:  Chronic pain  Hx of cervical cancer   Medications  0.9 %  sodium chloride infusion (0 mL/hr Intravenous Stopped 09/09/13 2100)  0.9 %  sodium chloride infusion (not administered)  iohexol (OMNIPAQUE) 300 MG/ML solution 80 mL (not administered)  HYDROmorphone (DILAUDID) injection 1 mg (1 mg Intravenous Given 09/09/13 1600)  HYDROmorphone (DILAUDID) injection 1 mg (1 mg Intravenous Given 09/09/13 1757)  HYDROmorphone (DILAUDID) injection 1 mg (1 mg Intravenous Given 09/09/13 1743)  iohexol (OMNIPAQUE) 300 MG/ML solution 80 mL (  80 mLs Intravenous Contrast Given 09/09/13 1948)  HYDROmorphone (DILAUDID) injection 1 mg (1 mg Intravenous Given 09/09/13 2101)  sodium chloride 0.9 % bolus 500 mL (500 mLs Intravenous New Bag/Given 09/09/13 2102)   Filed Vitals:   09/09/13 1429 09/09/13 1818  BP: 162/111 154/99  Pulse: 86 72  Temp: 98.6 F (37 C)   TempSrc: Oral   Resp: 18 16  SpO2: 97% 97%    Patient presenting to the ED with increased vaginal pain with vaginal bleeding and discharge that recently started. Patient has history of renal cell carcinoma and cervical cancer - reported that she went through radiation for 36 weeks. Stated that she has been using her pain medications of Oxycontin and Oxycodone without relief.  This provider reviewed the patient's chart. Patient was seen and assessed at [facility] on 06/25/2013 where chest xray was negative. CT abdomen and pelvis performed with contrast that noted similar appearance of heterogeneously enhancing cervical mass that encases the distal left ureter leading to left hydronephrosis and decreased perfusion of the left kidney, similar to prior exam that was performed. Left pulmonary nodule noted without change in size. Right lower lobe nodule new. Scattered groundglass appearance noted. Diffuse bladder wall thickening - decreased since prior exam that could be related to radiation. Left pole renal mass  noted that is similar in size. On patient's chart at [place] by the name of Charlaine Dalton appears on numerous charts with a phone of [phone], but this is the number to the orthopedic oncologist. This provider tried to call the palliative care center that the patient is part of, but no answer.  Patient laying in bed. Alert and oriented. GCS 15. Heart rate and rhythm normal. Lungs clear to auscultation to upper and lower lobes bilaterally. Negative signs of respiratory distress. Negative use of accessory muscles. Good lung expansion. Radial and DP pulses 2+ bilaterally. Full ROM to upper and lower extremities bilaterally without difficulty noted. Strength intact to upper and lower extremities bilaterally. Negative abdominal distension - BS normoactive in all 4 quadrants. Pain upon palpation to the suprapubic region and lower abdomen. Pelvic exam noted cervix with scant bright red blood thick white discharge - negative sign of hemorrhage.  EKG noted normal sinus rhythm with short PR interval nonspecific intraventricular conduction delay with a heart rate of 86 beats per minute. First troponin negative elevation. CBC negative elevation white blood cell count noted-low hemoglobin of 9.2, hematocrit 28.6 - patient has history of anemia. CMP noted normal kidney function-BUN 11, creatinine 0.91-normal liver functioning. UA negative for nitrites or leukocytes. Wet prep negative findings. GC Chlamydia probe pending. Chest xray noted emphysema changes with development of a 2.1 cm nodule in the left lower lung - decreased lung volumes with worsening bibasilar opacities, left greater than right suspicion to be pneumonia. CT abdomen and pelvis with contrast identified left lung nodule concerning for metastasis, multiple liver lesions are suspicious for metastasis-severe left hydronephrosis, ureteral obstruction appears to occur in the region of the inferior left adnexa, left renal mass identified  concerning for metastasis or primary neoplasm less likely. Enlarged heterogeneous and lobulated uterus noted. Evidence of rectal wall thickening with poor definition distal sigmoid this can be direct involvement of tumor or radiation therapy. CT abdomen and pelvis with contrast was performed in ED setting on 09/09/2013 has very similar findings when compared to CT abdomen and pelvis with contrast that was performed at [facility] on 06/25/2013. Bladder scan of 174, patient  has been able to void in the ED setting without difficulty and multiple times.  This provider spoke with hospice care and stated that patient has not been seen by her secondary to patient refusing their care. Patient is due to be discharged from hospice care on 09/12/2013 secondary to poor swallowing. Nurse reported the patient has history of drug-seeking abuse. Reported that patient has been hopping from pharmacy to pharmacy in [place] and [place]. As per nurse, reported that patient recently had medications dispense on 08/25/2013 approximately 200 tablets of oxycodone 15 mg IR and 30 tablets of OxyContin 60 mg. As per nurse, reported that patient should have plenty of pain medications left. Doubt pneumonia-suspicion of findings on chest x-ray to be associated with nodule and atelectasis-negative fever, hypoxia, hypotension - will start patient on antibiotics since patient has history of AIDs. Negative elevated WBC. Patient stable, afebrile. Negative acute findings identified a CT abdomen and pelvis. Patient has followup at [facility] both infectious disease and primary doctor. Patient given plenty of pain medication in ED setting, pain controlled. Discussed labs and imaging in great detail and reviewed with attending physician who cleared patient for discharge - patient seen and assessed by attending. Discharged patient. Referred patient to primary care provider and infectious disease. Discussed with patient to call hospice  care and given contact with hospice care regarding pain medications. Discussed with patient to rest and stay hydrated. Discussed with patient to closely monitor symptoms and if symptoms are to worsen or change to report back to the ED - strict return instructions given.  Patient agreed to plan of care, understood, all questions answered.   Jamse Mead, PA-C 09/10/13 1153

## 2013-09-09 NOTE — ED Notes (Signed)
Patient refused to let staff remove her IV, stated she "would not let us take it out until she received pain medication." Patient was told that she was up for discharge and was not being given any medication. When staff went to remove IV, she became agitated and pushed away staff. She was warned about contamination and possibility of infection, but she insisted and proceeded to rip out her own IV. Edward Qualia, ED RN was present during event. Other staff attempted to remove IV and it was unhelpful.

## 2013-09-09 NOTE — ED Provider Notes (Signed)
Patient seen/examined in the Emergency Department in conjunction with Midlevel Provider [place] Patient reports abdominal/back pain.  She has complex medical history including cervical cancer Exam : awake/alert, pt appears uncomfortable Plan: pt will need labs and likely CT imaging. She is currently stable   Sharyon Cable, MD 09/09/13 1630

## 2013-09-09 NOTE — ED Notes (Addendum)
patient demanding pain medication, states she will not comply until she gets something for pain-verbally abusive and not willing to listen to staff or follow protocals

## 2013-09-09 NOTE — Progress Notes (Signed)
   CARE MANAGEMENT ED NOTE 09/09/2013  Patient:  MARIGNY, BORRE   Account Number:  [id number]  Date Initiated:  09/09/2013  Documentation initiated by:  Jackelyn Poling  Subjective/Objective Assessment:   63 yr old medicaid of [place] covered pt with hospice services coverage PMH of cervical cancer, renal cell carcinoma currently on Hospice care recently taking Oxycodone 15 mg every 3 hours as needed for pain & Oxycontin 60 mg every 12 hrs     Subjective/Objective Assessment Detail:   for pain presenting to the ED with worsening abdominal/vaginal pain for the past 2 days. Patient reported that she is unable to explain the pain - reported that it is localized to her vagina, cervix, back, groin and bilateral legs    PCP Dr. Teryl Lucy  Oncologist Dr. Jacqualine Mau     Action/Plan:   CM spoke with [place] prior to her seeing the pt  EPIC providers updated   Action/Plan Detail:   Anticipated DC Date:       Status Recommendation to Physician:   Result of Recommendation:    Other ED [place]  Other  PCP issues  Outpatient Services - Pt will follow up    Choice offered to / List presented to:            Status of service:  Completed, signed off  ED Comments:   ED Comments Detail:

## 2013-09-09 NOTE — Discharge Instructions (Signed)
Please call your doctor for a followup appointment within 24-48 hours. When you talk to your doctor please let them know that you were seen in the emergency department and have them acquire all of your records so that they can discuss the findings with you and formulate a treatment plan to fully care for your new and ongoing problems. Please call and set-up an appointment with your primary care provider to be re-assessed with in the next 24-48 hours Please continue to take at home medications as prescribed Please rest and stay hydrated Please get in contact with hospice care Please continue to monitor symptoms and if symptoms are to worsen or change (fever greater than 101, chills, sweating, nausea, vomiting, chest pain, shortness of breath, difficulty breathing, numbness, tingling, fall, injury, headaches) please report back to the ED immediately   Chronic Back Pain  When back pain lasts longer than 3 months, it is called chronic back pain.People with chronic back pain often go through certain periods that are more intense (flare-ups).  CAUSES Chronic back pain can be caused by wear and tear (degeneration) on different structures in your back. These structures include:  The bones of your spine (vertebrae) and the joints surrounding your spinal cord and nerve roots (facets).  The strong, fibrous tissues that connect your vertebrae (ligaments). Degeneration of these structures may result in pressure on your nerves. This can lead to constant pain. HOME CARE INSTRUCTIONS  Avoid bending, heavy lifting, prolonged sitting, and activities which make the problem worse.  Take brief periods of rest throughout the day to reduce your pain. Lying down or standing usually is better than sitting while you are resting.  Take over-the-counter or prescription medicines only as directed by your caregiver. SEEK IMMEDIATE MEDICAL CARE IF:   You have weakness or numbness in one of your legs or feet.  You have  trouble controlling your bladder or bowels.  You have nausea, vomiting, abdominal pain, shortness of breath, or fainting. Document Released: 06/26/2004 Document Revised: 08/11/2011 Document Reviewed: 05/03/2011 [facility] Patient Information 2014 [place], [place].   Emergency Department Resource Guide 1) Find a Doctor and Pay Out of Pocket Although you won't have to find out who is covered by your insurance plan, it is a good idea to ask around and get recommendations. You will then need to call the office and see if the doctor you have chosen will accept you as a new patient and what types of options they offer for patients who are self-pay. Some doctors offer discounts or will set up payment plans for their patients who do not have insurance, but you will need to ask so you aren't surprised when you get to your appointment.  2) Contact Your Local Health Department Not all health departments have doctors that can see patients for sick visits, but many do, so it is worth a call to see if yours does. If you don't know where your local health department is, you can check in your phone book. The CDC also has a tool to help you locate your state's health department, and many state websites also have listings of all of their local health departments.  3) Find a Walk-in Clinic If your illness is not likely to be very severe or complicated, you may want to try a walk in clinic. These are popping up all over the country in pharmacies, drugstores, and shopping centers. They're usually staffed by nurse practitioners or physician assistants that have been trained to treat common illnesses and  complaints. They're usually fairly quick and inexpensive. However, if you have serious medical issues or chronic medical problems, these are probably not your best option.  No Primary Care Doctor: - Call Health Connect at  [phone] - they can help you locate a primary care doctor that  accepts your insurance, provides  certain services, etc. - Physician Referral Service- [phone]  Chronic Pain Problems: Organization         Address  Phone   Notes  [facility]  [phone] Patients need to be referred by their primary care doctor.   Medication Assistance: Organization         Address  Phone   Notes  [facility] Medication [facility] [address] [place], [place] [address] [phone] --Must be a resident of [facility] -- Must have NO insurance coverage whatsoever (no Medicaid/ Medicare, etc.) -- The pt. MUST have a primary care doctor that directs their care regularly and follows them in the community   MedAssist  [phone]   [employer]  [phone]    Agencies that provide inexpensive medical care: Organization         Address  Phone   Notes  [address]  [phone]   [facility]    [phone]   [facility] [place], [place] [address] [phone]   [place] [address] [place] [phone]   Planned Parenthood    [phone]   [facility]    [phone]   [place] and [place] [address], [place] Phone:  [phone], Fax:  [phone] Hours of Operation:  9 am - 6 pm, M-F.  Also accepts Medicaid/Medicare and self-pay.  [facility] for [place], [address], [place] Phone: [phone], Fax: [phone]. Hours of Operation:  8:30 am - 5:30 pm, M-F.  Also accepts Medicaid and self-pay.  [facility] [place] [address], [place] Phone: [phone]   [place], [place], [place] [phone], Ext. [phone] Mondays & Thursdays: 7-9 AM.  First 15 patients are seen on a first come, first serve basis.    [place] Providers:  Organization         Address  Phone   Notes  [facility] [address], [place] [phone] Also accepts self-pay patients.  [facility] [address], [place]  [phone]   [place], [address], [place] [phone]   [facility] [address] [place] [phone]   Lucianne Lei [address], [place]   [phone] Only accepts [place] Access [place] patients after they have their name applied to their card.   Self-Pay (no insurance) in [facility]:  Organization         Address  Phone   Notes  Sickle Cell Patients, [facility] [place] [phone]   [facility] [place] [phone]   [facility] [place]  [place], [address], [place] [phone]   [facility]/Dr. Osei-Bonsu  [address] [place] or [address], [place] (  [phone] Phone number for both [facility] and [place] locations is the same.  [facility] [address], [place] [phone]) [phone]   [facility] [place] [address] [place] [address], [place] or [address] [phone]) [phone]   [facility] [address], [place] [phone]) [phone], phone; [phone]) [phone], fax Sees patients 1st and 3rd Saturday of every month.  Must not qualify for public or private insurance (i.e. Medicaid, Medicare, [place] Health Choice, Veterans' Benefits)  Household income should be no more than 200% of the poverty level The clinic cannot treat you if you are pregnant or think you are pregnant  Sexually transmitted diseases are not treated at the clinic.    Dental Care: Organization         Address  Phone  Notes  [facility] [address], [place] [phone]) [phone]  Accepts children up to age 65 who are enrolled in [place] or [place] Health Choice; pregnant women with a Medicaid card; and children who have applied for Medicaid or [place] Health Choice, but were declined, whose parents can pay a reduced fee at time of service.  [facility] [place]  [address], [place] [phone]) [phone] Accepts children up to age 65 who are enrolled in [place] or [place] Health Choice; pregnant women with a Medicaid card; and children who have applied for Medicaid or [place] Health Choice, but were declined, whose parents can pay a reduced fee at time of service.  [facility]  [address], [place] [phone]) [phone] Patients are seen by appointment only. Walk-ins are not accepted. [facility] will see patients 65 years of age and older. Monday - Tuesday (8am-5pm) Most Wednesdays (8:30-5pm) $30 per visit, cash only  [facility]  [address], [facility] [phone]) [phone] Patients are seen by appointment only. Walk-ins are not accepted. [facility] will see patients 65 years of age and older. One Wednesday Evening (Monthly: Volunteer Based).  $30 per visit, cash only  [employer] [facility] [facility]  [phone] for adults; Children under age 744, call Graduate Pediatric Dentistry at [phone]) [phone]. Children aged 304-14, please call [phone] to request a pediatric application.  Dental services are provided in all areas of dental care including fillings, crowns and bridges, complete and partial dentures, implants, gum treatment, root canals, and extractions. Preventive care is also provided. Treatment is provided to both adults and children. Patients are selected via a lottery and there is often a waiting list.   [facility] [address], [place]  [phone]) [phone] [email or web address]   [facility] [address], [place], [place] [phone], Ext. [phone] Second  and Fourth Thursday of each month, opens at 6:30 AM; Clinic ends at 9 AM.  Patients are seen on a first-come first-served basis, and a limited number are seen during each clinic.   [facility]  [address], [place], [place] [phone]) [phone]   Eligibility Requirements You must have lived in [place], [place], or [place] counties for at least the last three months.   You cannot be eligible for state or federal sponsored National Cityhealthcare insurance, including CIGNAVeterans Administration, [place]icaid, or [employer]icare.   You generally cannot be eligible for healthcare insurance through your employer.    How to apply: Eligibility screenings are held every Tuesday and Wednesday afternoon  from 1:00 pm until 4:00 pm. You do not need an appointment for the interview!  [facility] [address], [place], [place]   [place]  [place] Department  [place]  [phone]    Behavioral Health Resources in the Community: Intensive Outpatient Programs Organization         Address  Phone  Notes  [place]. [address] [place], [place] [phone]   [facility] [address] [place], [place]   ADS: Alcohol & Drug Svcs [address] [place], [place]   [place] [address]  [place], [place] or [phone]   Substance Abuse Resources Organization         Address  Phone  Notes  Alcohol and Drug Services  [phone]   [place]  [phone]   The [place]   Chinita Pester  [phone]   Residential & Outpatient Substance Abuse Program  [phone]   Psychological Services Organization         Address  Phone  Notes  [facility] [place]  [place]  [phone]   [place] [address] [place] or [phone]    Mobile Crisis Teams Organization         Address  Phone  Notes  [facility], Mobile Crisis Care Unit  [phone]   Assertive Psychotherapeutic Services  [address] [place], [place]   [place] Levels [address] [place] [phone]    Self-Help/Support Groups Organization         Address  Phone             Notes  [place]. of [place] - variety of support groups  [place] Call for more information  Narcotics Anonymous (NA), [facility] [address], [employer] [place]  2 meetings at this location   Special educational needs teacher         Address  Phone  Notes  ASAP Residential Treatment [place],    [place]  [phone]   [facility]  [address], [place] [address], [place], [place]   [place], [place] [phone] Admissions: 8am-3pm M-F  Incentives Substance [place] [address]    [place], [place] [phone]   [facility] [address] [place], [place], [place]   [facility] [address],  [place], [place]   [facility]ight Programs - Intensive Outpatient [address], [place], [place]   [facility] ([place].,  [place], [place] [phone] or [phone]   [facility] Services (RTS) [address], [place], [place] Accepts Medicaid  [facility] [place] [address],  [place] 1-726-235-0445 Substance Abuse/Addiction Treatment   [facility] Organization         Address  Phone  Notes  [facility]  [phone]   Domenic Schwab, PhD [address] Arlis [place], [place]   2544757279 or [phone]   [place], [place]  [phone]   [place] [address] [place], [place] [phone] Insurance/Medicaid/sponsorship through [employer] and Families  [place], [place] [phone] [place], [place] [phone]    Dr. Adele Schilder  [phone]   [facility] [place] Dept. 1) [address], [place] 2) [place] 3)  [place] [address], [place] [phone]  [phone]   [place] [phone] or [phone] (After Hours)

## 2013-09-09 NOTE — ED Notes (Signed)
Pt is hospice and palliative care patient of [place]. Their contact information is [phone].

## 2013-09-09 NOTE — ED Notes (Signed)
Per EMS, pt from home.  Pt has generalized body pain which is chronic d/t hx of cancer.  Pt states she normally takes oxycodone at home but is out of meds.  Pt on hospice care at home.  Fentanyl 147mcg in route through 20g in LAC.  Vitals: 136/98, hr 87, resp 22, 99% ra.  No change in pain assessment however pt has stopped "screaming" from original assessment by EMS.

## 2013-09-09 NOTE — ED Notes (Signed)
Patient is refusing to have anything done to her until she gets pain medication.

## 2013-09-09 NOTE — ED Notes (Signed)
Bed: WA09 Expected date:  Expected time:  Means of arrival:  Comments: EMS-pain, out of meds

## 2013-09-09 NOTE — ED Notes (Signed)
Pt being very rude - wont let me get labs until she gets pain medication.

## 2013-09-10 LAB — GC/CHLAMYDIA PROBE AMP
CT Probe RNA: NEGATIVE
GC PROBE AMP APTIMA: NEGATIVE

## 2013-09-11 NOTE — ED Provider Notes (Signed)
Medical screening examination/treatment/procedure(s) were conducted as a shared visit with non-physician practitioner(s) and myself.  I personally evaluated the patient during the encounter.   EKG Interpretation   Date/Time:  Friday September 09 2013 16:07:53 EDT Ventricular Rate:  86 PR Interval:  117 QRS Duration: 115 QT Interval:  389 QTC Calculation: 465 R Axis:   60 Text Interpretation:  Sinus rhythm Borderline short PR interval  Nonspecific intraventricular conduction delay Confirmed by Christy Gentles  MD,  Dontell Mian ([id number]) on 09/09/2013 4:47:11 PM        Sharyon Cable, MD 09/11/13 1438

## 2014-04-14 ENCOUNTER — Emergency Department (HOSPITAL_COMMUNITY): Payer: Medicare Other

## 2014-04-14 ENCOUNTER — Inpatient Hospital Stay (HOSPITAL_COMMUNITY)
Admission: EM | Admit: 2014-04-14 | Discharge: 2014-04-18 | DRG: 974 | Disposition: A | Discharge status: Home / Self Care | Payer: Medicare Other | Attending: Family Medicine | Admitting: Family Medicine

## 2014-04-14 ENCOUNTER — Encounter (HOSPITAL_COMMUNITY): Payer: Self-pay | Admitting: *Deleted

## 2014-04-14 DIAGNOSIS — Z9119 Patient's noncompliance with other medical treatment and regimen: Secondary | ICD-10-CM | POA: Diagnosis present

## 2014-04-14 DIAGNOSIS — E860 Dehydration: Secondary | ICD-10-CM | POA: Diagnosis present

## 2014-04-14 DIAGNOSIS — E872 Acidosis: Secondary | ICD-10-CM | POA: Diagnosis present

## 2014-04-14 DIAGNOSIS — Z681 Body mass index (BMI) 19 or less, adult: Secondary | ICD-10-CM | POA: Diagnosis not present

## 2014-04-14 DIAGNOSIS — C539 Malignant neoplasm of cervix uteri, unspecified: Secondary | ICD-10-CM | POA: Diagnosis not present

## 2014-04-14 DIAGNOSIS — B20 Human immunodeficiency virus [HIV] disease: Principal | ICD-10-CM | POA: Diagnosis present

## 2014-04-14 DIAGNOSIS — G893 Neoplasm related pain (acute) (chronic): Secondary | ICD-10-CM | POA: Diagnosis not present

## 2014-04-14 DIAGNOSIS — C787 Secondary malignant neoplasm of liver and intrahepatic bile duct: Secondary | ICD-10-CM | POA: Diagnosis present

## 2014-04-14 DIAGNOSIS — Z515 Encounter for palliative care: Secondary | ICD-10-CM

## 2014-04-14 DIAGNOSIS — N390 Urinary tract infection, site not specified: Secondary | ICD-10-CM

## 2014-04-14 DIAGNOSIS — D509 Iron deficiency anemia, unspecified: Secondary | ICD-10-CM | POA: Diagnosis present

## 2014-04-14 DIAGNOSIS — B1920 Unspecified viral hepatitis C without hepatic coma: Secondary | ICD-10-CM | POA: Diagnosis present

## 2014-04-14 DIAGNOSIS — N136 Pyonephrosis: Secondary | ICD-10-CM | POA: Diagnosis present

## 2014-04-14 DIAGNOSIS — R630 Anorexia: Secondary | ICD-10-CM | POA: Insufficient documentation

## 2014-04-14 DIAGNOSIS — Z66 Do not resuscitate: Secondary | ICD-10-CM | POA: Diagnosis present

## 2014-04-14 DIAGNOSIS — Z8541 Personal history of malignant neoplasm of cervix uteri: Secondary | ICD-10-CM

## 2014-04-14 DIAGNOSIS — B9620 Unspecified Escherichia coli [E. coli] as the cause of diseases classified elsewhere: Secondary | ICD-10-CM | POA: Diagnosis not present

## 2014-04-14 DIAGNOSIS — J439 Emphysema, unspecified: Secondary | ICD-10-CM | POA: Diagnosis not present

## 2014-04-14 DIAGNOSIS — F17210 Nicotine dependence, cigarettes, uncomplicated: Secondary | ICD-10-CM | POA: Diagnosis present

## 2014-04-14 DIAGNOSIS — R64 Cachexia: Secondary | ICD-10-CM | POA: Diagnosis present

## 2014-04-14 DIAGNOSIS — C7800 Secondary malignant neoplasm of unspecified lung: Secondary | ICD-10-CM | POA: Diagnosis present

## 2014-04-14 DIAGNOSIS — N179 Acute kidney failure, unspecified: Secondary | ICD-10-CM

## 2014-04-14 DIAGNOSIS — B029 Zoster without complications: Secondary | ICD-10-CM | POA: Diagnosis present

## 2014-04-14 DIAGNOSIS — R079 Chest pain, unspecified: Secondary | ICD-10-CM

## 2014-04-14 DIAGNOSIS — Z7189 Other specified counseling: Secondary | ICD-10-CM | POA: Insufficient documentation

## 2014-04-14 DIAGNOSIS — Z85528 Personal history of other malignant neoplasm of kidney: Secondary | ICD-10-CM | POA: Diagnosis not present

## 2014-04-14 DIAGNOSIS — E43 Unspecified severe protein-calorie malnutrition: Secondary | ICD-10-CM | POA: Diagnosis present

## 2014-04-14 DIAGNOSIS — Z1624 Resistance to multiple antibiotics: Secondary | ICD-10-CM | POA: Diagnosis not present

## 2014-04-14 LAB — URINALYSIS, ROUTINE W REFLEX MICROSCOPIC
Bilirubin Urine: NEGATIVE
Glucose, UA: NEGATIVE mg/dL
Ketones, ur: NEGATIVE mg/dL
NITRITE: POSITIVE — AB
Protein, ur: 100 mg/dL — AB
SPECIFIC GRAVITY, URINE: 1.011 (ref 1.005–1.030)
UROBILINOGEN UA: 0.2 mg/dL (ref 0.0–1.0)
pH: 6 (ref 5.0–8.0)

## 2014-04-14 LAB — CBC WITH DIFFERENTIAL/PLATELET
BASOS ABS: 0 10*3/uL (ref 0.0–0.1)
Basophils Relative: 0 % (ref 0–1)
Eosinophils Absolute: 0.4 10*3/uL (ref 0.0–0.7)
Eosinophils Relative: 4 % (ref 0–5)
HCT: 25.6 % — ABNORMAL LOW (ref 36.0–46.0)
Hemoglobin: 7.9 g/dL — ABNORMAL LOW (ref 12.0–15.0)
LYMPHS ABS: 0.6 10*3/uL — AB (ref 0.7–4.0)
LYMPHS PCT: 6 % — AB (ref 12–46)
MCH: 21.2 pg — ABNORMAL LOW (ref 26.0–34.0)
MCHC: 30.9 g/dL (ref 30.0–36.0)
MCV: 68.6 fL — AB (ref 78.0–100.0)
Monocytes Absolute: 0.7 10*3/uL (ref 0.1–1.0)
Monocytes Relative: 7 % (ref 3–12)
Neutro Abs: 7.9 10*3/uL — ABNORMAL HIGH (ref 1.7–7.7)
Neutrophils Relative %: 83 % — ABNORMAL HIGH (ref 43–77)
PLATELETS: DECREASED 10*3/uL (ref 150–400)
RBC: 3.73 MIL/uL — ABNORMAL LOW (ref 3.87–5.11)
RDW: 22.1 % — AB (ref 11.5–15.5)
WBC: 9.6 10*3/uL (ref 4.0–10.5)

## 2014-04-14 LAB — URINE MICROSCOPIC-ADD ON

## 2014-04-14 LAB — COMPREHENSIVE METABOLIC PANEL
ALBUMIN: 2 g/dL — AB (ref 3.5–5.2)
ALT: 6 U/L (ref 0–35)
ANION GAP: 18 — AB (ref 5–15)
AST: 14 U/L (ref 0–37)
Alkaline Phosphatase: 230 U/L — ABNORMAL HIGH (ref 39–117)
BUN: 32 mg/dL — AB (ref 6–23)
CALCIUM: 10.4 mg/dL (ref 8.4–10.5)
CO2: 17 mEq/L — ABNORMAL LOW (ref 19–32)
Chloride: 99 mEq/L (ref 96–112)
Creatinine, Ser: 2.02 mg/dL — ABNORMAL HIGH (ref 0.50–1.10)
GFR calc Af Amer: 29 mL/min — ABNORMAL LOW (ref >90)
GFR calc non Af Amer: 25 mL/min — ABNORMAL LOW (ref >90)
Glucose, Bld: 71 mg/dL (ref 70–99)
POTASSIUM: 4.8 meq/L (ref 3.7–5.3)
Sodium: 134 mEq/L — ABNORMAL LOW (ref 137–147)
TOTAL PROTEIN: 9.2 g/dL — AB (ref 6.0–8.3)
Total Bilirubin: 0.4 mg/dL (ref 0.3–1.2)

## 2014-04-14 LAB — TROPONIN I

## 2014-04-14 LAB — POC OCCULT BLOOD, ED: Fecal Occult Bld: NEGATIVE

## 2014-04-14 LAB — LIPASE, BLOOD: LIPASE: 11 U/L (ref 11–59)

## 2014-04-14 LAB — PRO B NATRIURETIC PEPTIDE: PRO B NATRI PEPTIDE: 1200 pg/mL — AB (ref 0–125)

## 2014-04-14 MED ORDER — ONDANSETRON HCL 4 MG PO TABS: 4.0000 mg | ORAL_TABLET | Freq: Four times a day (QID) | ORAL | Status: DC | PRN

## 2014-04-14 MED ORDER — ONDANSETRON HCL 4 MG/2ML IJ SOLN
4.0000 mg | Freq: Once | INTRAMUSCULAR | Status: AC
  Administered 2014-04-14: 4 mg via INTRAVENOUS
  Filled 2014-04-14: qty 2

## 2014-04-14 MED ORDER — DIPHENHYDRAMINE HCL 12.5 MG/5ML PO ELIX: 12.5000 mg | ORAL_SOLUTION | Freq: Four times a day (QID) | ORAL | Status: DC | PRN

## 2014-04-14 MED ORDER — HYDROMORPHONE HCL 1 MG/ML IJ SOLN
1.0000 mg | INTRAMUSCULAR | Status: AC
  Administered 2014-04-14: 1 mg via INTRAVENOUS
  Filled 2014-04-14: qty 1

## 2014-04-14 MED ORDER — SODIUM CHLORIDE 0.9 % IV SOLN
1.5000 mg/h | INTRAVENOUS | Status: DC
  Filled 2014-04-14: qty 5

## 2014-04-14 MED ORDER — ACETAMINOPHEN 650 MG RE SUPP: 650.0000 mg | Freq: Four times a day (QID) | RECTAL | Status: DC | PRN

## 2014-04-14 MED ORDER — SULFAMETHOXAZOLE-TRIMETHOPRIM 800-160 MG PO TABS
1.0000 | ORAL_TABLET | Freq: Every day | ORAL | Status: DC
  Administered 2014-04-14: 1 via ORAL
  Filled 2014-04-14 (×3): qty 1

## 2014-04-14 MED ORDER — LIDOCAINE HCL 2 % IJ SOLN
5.0000 mL | Freq: Once | INTRAMUSCULAR | Status: AC
  Administered 2014-04-14: 1.5 mg
  Filled 2014-04-14: qty 20

## 2014-04-14 MED ORDER — HYDROMORPHONE BOLUS VIA INFUSION: 1.0000 mg | INTRAVENOUS | Status: DC | PRN

## 2014-04-14 MED ORDER — DEXTROSE-NACL 5-0.9 % IV SOLN
INTRAVENOUS | Status: DC
  Administered 2014-04-14: 09:00:00 via INTRAVENOUS

## 2014-04-14 MED ORDER — TRAMADOL HCL 50 MG PO TABS: 50.0000 mg | ORAL_TABLET | Freq: Four times a day (QID) | ORAL | Status: DC | PRN

## 2014-04-14 MED ORDER — HYDROMORPHONE 0.3 MG/ML IV SOLN: INTRAVENOUS | Status: DC

## 2014-04-14 MED ORDER — MORPHINE SULFATE 2 MG/ML IJ SOLN: 1.0000 mg | INTRAMUSCULAR | Status: DC | PRN

## 2014-04-14 MED ORDER — HYDROMORPHONE HCL 1 MG/ML IJ SOLN
0.5000 mg | Freq: Once | INTRAMUSCULAR | Status: AC
  Administered 2014-04-14: 0.5 mg via INTRAVENOUS
  Filled 2014-04-14: qty 1

## 2014-04-14 MED ORDER — HYDROMORPHONE HCL PF 10 MG/ML IJ SOLN
1.0000 mg/h | INTRAMUSCULAR | Status: DC
  Administered 2014-04-14: 0.5 mg/h via INTRAVENOUS
  Filled 2014-04-14: qty 2.5

## 2014-04-14 MED ORDER — HYDROMORPHONE BOLUS VIA INFUSION
1.0000 mg | INTRAVENOUS | Status: DC | PRN
Start: 2014-04-14 — End: 2014-04-14
  Administered 2014-04-14: 1 mg via INTRAVENOUS
  Filled 2014-04-14 (×2): qty 1

## 2014-04-14 MED ORDER — DIPHENHYDRAMINE HCL 50 MG/ML IJ SOLN: 12.5000 mg | Freq: Four times a day (QID) | INTRAMUSCULAR | Status: DC | PRN

## 2014-04-14 MED ORDER — OXYCODONE HCL 5 MG PO TABS: 30.0000 mg | ORAL_TABLET | ORAL | Status: DC | PRN

## 2014-04-14 MED ORDER — SODIUM CHLORIDE 0.9 % IV SOLN
1.5000 mg/h | INTRAVENOUS | Status: DC
  Administered 2014-04-15 – 2014-04-16 (×3): 1.5 mg/h via INTRAVENOUS
  Filled 2014-04-14 (×5): qty 2.5

## 2014-04-14 MED ORDER — DEXTROSE 5 % IV SOLN
1.0000 g | INTRAVENOUS | Status: DC
  Administered 2014-04-14: 1 g via INTRAVENOUS
  Filled 2014-04-14 (×2): qty 10

## 2014-04-14 MED ORDER — ONDANSETRON HCL 4 MG/2ML IJ SOLN: 4.0000 mg | Freq: Four times a day (QID) | INTRAMUSCULAR | Status: DC | PRN

## 2014-04-14 MED ORDER — MORPHINE SULFATE ER 30 MG PO TBCR: 60.0000 mg | EXTENDED_RELEASE_TABLET | Freq: Two times a day (BID) | ORAL | Status: DC | PRN

## 2014-04-14 MED ORDER — SODIUM CHLORIDE 0.9 % IV SOLN
1.5000 mg/h | INTRAVENOUS | Status: DC
  Administered 2014-04-14: 1.5 mg/h via INTRAVENOUS
  Filled 2014-04-14 (×2): qty 2.5

## 2014-04-14 MED ORDER — HYDROMORPHONE BOLUS VIA INFUSION
1.5000 mg | INTRAVENOUS | Status: DC | PRN
  Administered 2014-04-14 – 2014-04-16 (×5): 1.5 mg via INTRAVENOUS
  Filled 2014-04-14 (×6): qty 2

## 2014-04-14 MED ORDER — HYDROMORPHONE BOLUS VIA INFUSION
1.0000 mg | INTRAVENOUS | Status: DC | PRN
  Administered 2014-04-14 (×3): 1 mg via INTRAVENOUS
  Filled 2014-04-14 (×4): qty 1

## 2014-04-14 MED ORDER — ACETAMINOPHEN 325 MG PO TABS: 650.0000 mg | ORAL_TABLET | Freq: Four times a day (QID) | ORAL | Status: DC | PRN

## 2014-04-14 NOTE — ED Notes (Signed)
Pt has assigned bed 30 mins, attempted report.

## 2014-04-14 NOTE — Clinical Social Work Note (Signed)
Per RN, patient's daughter requesting to speak with CSW. CSW went by patient's room, daughter not in room.    Liz Beach MSW, [place], [place], [phone]

## 2014-04-14 NOTE — Consult Note (Addendum)
Patient Christine Vincent      DOB: 1950-08-18      OEU:[id number]     Consult Note from the Palliative Medicine Team at [place] Requested by: Dr Hal Hope     PCP: Eyvonne Mechanic, MD Reason for Consultation: goals of care, hospice     Phone [phone]  Assessment of patients Current state: 63 yo female with HIV, metastatic cervical CA, bilateral hydronephrosis 2/2 malignancy (refused neph tubes), h/o substance abuse and difficult to control cancer related pain who presented with lethargy, weakness of several days duration.   1.  Code Status: DNR  2. Goals of Care:  I know Christine Vincent very well from my time as a palliative medicine attending at [facility]. She has an extremely complex and difficult social situation with history of substance abuse, misuse of her opioid pain medications and poor social support.  She was intermittently on hospice care for several months while I followed her in clinic there.  She was actually discharged from hospice agency by [facility] at one point because of compliance issues.  Needless to say, she has had contentious relationship with hospice and even mistrust.  She has outlived my prognosis as well as Gyn/Onc prognosis for her by several months but I think she is coming to the end of her disease course. Her daughter recognizes this as well.  Christine Vincent wants to go home but family can not care for her.  She is refusing hospice care both at home or in hospice facility.  This likely stems from this prior contentious relationship.  Main focus of care to be on comfort but her disposition from here will be challenging. I have recommended residential hospice care for her but at this time Christine Vincent refuses to go and daughter wants to respect her decision.  Will see if her goals evolve.  Christine Vincent has never coped well with her situation and has always had poor insight into her care. This is exacerbated by her current situation.    3. Symptom Management:   1. Cancer Related  Pain: Was seeing Dr Nancie Neas at [facility] palliative care.  Home regimen of MSSR 7m TID and oxycodone 3109mPRN (up to 8/day).  Difficulty swallowing pills. I will go ahead and put her on dilaudid regimen to simulate long acting and short acting needs.  Dilaudid 0.41m68mr continuous infusion with 1mg12mN bolus dose. I think we should avoid morphine with her declining renal function.  2. Anorexia/cachexia:  Related to advanced disease and heading toward dying process.  Oral care. Do not think meds will be beneficial 3. Bilateral Hydronephrosis: Refused stent placement. Had been following with Urology and had home foley.  Her creatinine was on the rise as outpatient and 1.8 in [facility] think little to do.  On Abx for potential UTI.  If not showing rapid improvement would have low threshold to d/c abx.    4. Psychosocial/Spiritual: Very challenging situation.  Christine Vincent has had a difficult life.  She often has had conflict in all her personal relationships (even with family). The first time I met her, she kicked me out of the room and would only see our Palliative Fellow.  Her poor social situation has been complicated by drug abuse. She was with several different hospice agencies and discharged due to difficulty in her care and compliance. [place] to see family.  Now back in [place] past few months and had been following back  at [facility].  Has always coped poorly with her disease.  Had been staying at home with her daughter and support from her brother Christine Vincent.    Brief HPI: 63 yo female with PMHx of HIV, Hep C, drug abuse, metastatic cervical CA (liver, pulm mets, intra-abdominal disease), bilateral severe hydronephrosis 2/2 malignancy for which she refused stent placement. She is very well known to me from my time at [facility].  She has significant cancer related pain which has been made challenging by her social situation and drug abuse history. Under  hospice care with several different agencies in the past.  She presented with chest pain and increased dyspnea for several days duration.  She was accompanied by her daughter whom I also know well from [facility] as she often accompanied her mom.  Daughter states that she has progressively declined for few months but actually had maintained some stability in pain control and her disease while in [place].  The past week and few days have seen rapid decline however.  She has not ate/drank anything or been able to get out of bed in days.  More lethargic as well.  Christine Vincent feels weak and not able to provide me any detailed history. Complains of pain all over, but especially across her abdomen.  Wants to go home but does not want hospice care. She had been following with Dr Nancie Neas at [facility] palliative care in addition to Dr Janalyn Harder with [facility].  I have reveiwed her outside records.    ROS: Full ROS negative unless otherwise mentioned above.      PMH:  Past Medical History  Diagnosis Date  . AIDS   . Neuropathy   . Herpes   . Toxoplasmosis   . HIV disease   . Herpes   . Neuropathy   . Kidney mass   . Hepatitis C virus   . Vaginal venereal warts   . Cervical cancer   . Renal cell carcinoma     left kidney; presumable     ONG:EXBMWUX reviewed. No pertinent past surgical history. I have reviewed the [place] and SH and  If appropriate update it with new information. Allergies  Allergen Reactions  . Adhesive [Tape] Itching and Other (See Comments)    "Burns skin"  . Aspirin Hives, Itching and Nausea And Vomiting  . Pork-Derived Metallurgist and Other (See Comments)   Scheduled Meds: . cefTRIAXone (ROCEPHIN)  IV  1 g Intravenous Q24H  .  HYDROmorphone (DILAUDID) injection  1 mg Intravenous NOW  . sulfamethoxazole-trimethoprim  1 tablet Oral Daily   Continuous Infusions: . dextrose 5 % and 0.9% NaCl    . HYDROmorphone     PRN Meds:.acetaminophen **OR** acetaminophen, HYDROmorphone, ondansetron  **OR** ondansetron (ZOFRAN) IV    BP 146/107 mmHg  Pulse 96  Temp(Src) 97.5 F (36.4 C) (Oral)  Resp 20  SpO2 99%   PPS: 20  No intake or output data in the 24 hours ending 04/14/14 0907   Physical Exam:  General: Lethargic, cachectic, occaisonally verbal and moans in pain HEENT:  [place], sclera anicteric Neck: thin, no masses Chest:   CTAB CVS: RRR Abdomen: soft, tender especially in lower abdomen Ext: no edema Neuro: moves all ext, mild confusion Skin: warm/dry Lymph: no palpable cervical adenopathy Psych: appears able to have some conversation but avoidant.   Labs: CBC    Component Value Date/Time   WBC 9.6 04/14/2014 0217   RBC 3.73* 04/14/2014 0217   HGB 7.9* 04/14/2014 0217  HCT 25.6* 04/14/2014 0217   PLT  04/14/2014 0217    PLATELET CLUMPS NOTED ON SMEAR, COUNT APPEARS DECREASED   MCV 68.6* 04/14/2014 0217   MCH 21.2* 04/14/2014 0217   MCHC 30.9 04/14/2014 0217   RDW 22.1* 04/14/2014 0217   LYMPHSABS 0.6* 04/14/2014 0217   MONOABS 0.7 04/14/2014 0217   EOSABS 0.4 04/14/2014 0217   BASOSABS 0.0 04/14/2014 0217    BMET    Component Value Date/Time   NA 134* 04/14/2014 0217   K 4.8 04/14/2014 0217   CL 99 04/14/2014 0217   CO2 17* 04/14/2014 0217   GLUCOSE 71 04/14/2014 0217   BUN 32* 04/14/2014 0217   CREATININE 2.02* 04/14/2014 0217   CALCIUM 10.4 04/14/2014 0217   GFRNONAA 25* 04/14/2014 0217   GFRAA 29* 04/14/2014 0217    CMP     Component Value Date/Time   NA 134* 04/14/2014 0217   K 4.8 04/14/2014 0217   CL 99 04/14/2014 0217   CO2 17* 04/14/2014 0217   GLUCOSE 71 04/14/2014 0217   BUN 32* 04/14/2014 0217   CREATININE 2.02* 04/14/2014 0217   CALCIUM 10.4 04/14/2014 0217   PROT 9.2* 04/14/2014 0217   ALBUMIN 2.0* 04/14/2014 0217   AST 14 04/14/2014 0217   ALT 6 04/14/2014 0217   ALKPHOS 230* 04/14/2014 0217   BILITOT 0.4 04/14/2014 0217   GFRNONAA 25* 04/14/2014 0217   GFRAA 29* 04/14/2014 0217   11/13  CXR IMPRESSION: Emphysema without acute disease.  Sept 2015 Renal US at [facility] reveals moderate to severe hydronephrosis on left and severe hydronephrosis on right  Greater than 50%  of this time was spent counseling and coordinating care related to the above assessment and plan.   Doran Clay D.O. Palliative Medicine Team at [facility]  Pager: [phone] Team Phone: [phone]    ADDENDUM: Basal infusion of dilaudid increased to 55m/hr as patient having severe pain.  Not really asking for PRN bolus doses. I will go ahead and order PCA to allow 0.580mq1529mPRN.  I am not worried about oversedation as she is very opioid tolerant and I think her lethargy is more related to terminal cancer. Goal of her care is comfort oriented.   AarDoran ClayO. Palliative Medicine Team at [facility]: [phone] Phone: [phone]

## 2014-04-14 NOTE — ED Provider Notes (Signed)
CSN: [id number]     Arrival date & time 04/14/14  0154 History   First MD Initiated Contact with Patient 04/14/14 0154     Chief Complaint  Patient presents with  . Chest Pain  . Cancer  . Shortness of Breath     (Consider location/radiation/quality/duration/timing/severity/associated sxs/prior Treatment) HPI Comments: Patient presents to the ER for evaluation of chest pain. Patient has past medical history of AIDS as well as cervical cancer and renal cell cancer. Patient is currently on hospice. She reportedly has been complaining of chest pain tonight, but tells me that she has had it for 2 days. Pain has been constant. She has not identified alleviating or exacerbating factors. She does have shortness of breath. Family reports that she has not been eating or drinking for several days. No vomiting or diarrhea.  Patient is a 63 y.o. female presenting with chest pain and shortness of breath.  Chest Pain Associated symptoms: fatigue and shortness of breath   Shortness of Breath Associated symptoms: chest pain     Past Medical History  Diagnosis Date  . AIDS   . Neuropathy   . Herpes   . Toxoplasmosis   . HIV disease   . Herpes   . Neuropathy   . Kidney mass   . Hepatitis C virus   . Vaginal venereal warts   . Cervical cancer   . Renal cell carcinoma     left kidney; presumable   History reviewed. No pertinent past surgical history. Family History  Problem Relation Age of Onset  . Hypertension Maternal Grandmother   . Cancer Maternal Grandmother     lung ca  . Stroke Maternal Grandmother    History  Substance Use Topics  . Smoking status: Current Every Day Smoker -- 0.50 packs/day    Types: Cigarettes  . Smokeless tobacco: Never Used  . Alcohol Use: No   OB History    No data available     Review of Systems  Constitutional: Positive for fatigue.  Respiratory: Positive for shortness of breath.   Cardiovascular: Positive for chest pain.  All other systems  reviewed and are negative.     Allergies  Adhesive; Aspirin; and Pork-derived products  Home Medications   Prior to Admission medications   Medication Sig Start Date End Date Taking? Authorizing Provider  Bisacodyl (LAXATIVE PO) Take 15 mLs by mouth daily.   Yes Historical Provider, MD  morphine (MS CONTIN) 60 MG 12 hr tablet Take 60 mg by mouth 2 (two) times daily as needed for pain.   Yes Historical Provider, MD  ondansetron (ZOFRAN) 4 MG tablet Take 4 mg by mouth every 8 (eight) hours as needed for nausea or vomiting.   Yes Historical Provider, MD  oxycodone (ROXICODONE) 30 MG immediate release tablet Take 30 mg by mouth every 4 (four) hours as needed for pain.   Yes Historical Provider, MD  sulfamethoxazole-trimethoprim (BACTRIM DS,SEPTRA DS) 800-160 MG per tablet Take 1 tablet by mouth daily.   Yes Historical Provider, MD  traMADol (ULTRAM) 50 MG tablet Take 50 mg by mouth every 6 (six) hours as needed (for headache).    Yes Historical Provider, MD  levofloxacin (LEVAQUIN) 250 MG tablet Take 3 tablets (750 mg total) by mouth daily. 09/09/13   Marissa Sciacca, PA-C  oxyCODONE (ROXICODONE) 15 MG immediate release tablet Take 30 mg by mouth every 3 (three) hours as needed for pain.    Historical Provider, MD  OxyCODONE HCl ER (OXYCONTIN) 60 MG T12A  Take 60 mg by mouth every 12 (twelve) hours.    Historical Provider, MD   BP 147/107 mmHg  Pulse 66  Temp(Src) 96.3 F (35.7 C) (Rectal)  Resp 14  SpO2 100% Physical Exam  Constitutional: She is oriented to person, place, and time. She appears well-developed and well-nourished. She appears distressed.  cachectic  HENT:  Head: Normocephalic and atraumatic.  Right Ear: Hearing normal.  Left Ear: Hearing normal.  Nose: Nose normal.  Mouth/Throat: Oropharynx is clear and moist and mucous membranes are normal.  Eyes: Conjunctivae and EOM are normal. Pupils are equal, round, and reactive to light.  Neck: Normal range of motion. Neck  supple.  Cardiovascular: Regular rhythm, S1 normal and S2 normal.  Exam reveals no gallop and no friction rub.   No murmur heard. Pulmonary/Chest: Effort normal and breath sounds normal. Tachypnea noted. No respiratory distress. She exhibits no tenderness.  Abdominal: Soft. Normal appearance and bowel sounds are normal. There is no hepatosplenomegaly. There is generalized tenderness. There is no rebound, no guarding, no tenderness at McBurney's point and negative Murphy's sign. No hernia.  Musculoskeletal: Normal range of motion.  Neurological: She is alert and oriented to person, place, and time. She has normal strength. No cranial nerve deficit or sensory deficit. Coordination normal. GCS eye subscore is 4. GCS verbal subscore is 5. GCS motor subscore is 6.  Skin: Skin is warm, dry and intact. No rash noted. No cyanosis.  Psychiatric: She has a normal mood and affect. Her speech is normal and behavior is normal. Thought content normal.  Nursing note and vitals reviewed.   ED Course  Procedures (including critical care time) Labs Review Labs Reviewed  CBC WITH DIFFERENTIAL - Abnormal; Notable for the following:    RBC 3.73 (*)    Hemoglobin 7.9 (*)    HCT 25.6 (*)    MCV 68.6 (*)    MCH 21.2 (*)    RDW 22.1 (*)    Neutrophils Relative % 83 (*)    Lymphocytes Relative 6 (*)    Neutro Abs 7.9 (*)    Lymphs Abs 0.6 (*)    All other components within normal limits  COMPREHENSIVE METABOLIC PANEL - Abnormal; Notable for the following:    Sodium 134 (*)    CO2 17 (*)    BUN 32 (*)    Creatinine, Ser 2.02 (*)    Total Protein 9.2 (*)    Albumin 2.0 (*)    Alkaline Phosphatase 230 (*)    GFR calc non Af Amer 25 (*)    GFR calc Af Amer 29 (*)    Anion gap 18 (*)    All other components within normal limits  URINALYSIS, ROUTINE W REFLEX MICROSCOPIC - Abnormal; Notable for the following:    APPearance TURBID (*)    Hgb urine dipstick LARGE (*)    Protein, ur 100 (*)    Nitrite  POSITIVE (*)    Leukocytes, UA LARGE (*)    All other components within normal limits  PRO B NATRIURETIC PEPTIDE - Abnormal; Notable for the following:    Pro B Natriuretic peptide (BNP) 1200.0 (*)    All other components within normal limits  URINE MICROSCOPIC-ADD ON - Abnormal; Notable for the following:    Bacteria, UA MANY (*)    All other components within normal limits  LIPASE, BLOOD  TROPONIN I  BLOOD GAS, ARTERIAL  POC OCCULT BLOOD, ED    Imaging Review Dg Chest [facility] 1 [address]  04/14/2014   CLINICAL DATA:  Chest pain.  EXAM: PORTABLE CHEST - 1 VIEW  COMPARISON:  PA and lateral chest 09/09/2013 and 08/19/2012.  FINDINGS: The lungs appear emphysematous but are clear. No pneumothorax or pleural effusion is identified. Heart size is normal. No focal bony abnormality is identified.  IMPRESSION: Emphysema without acute disease.   Electronically Signed   By: Inge Rise M.D.   On: 04/14/2014 02:59     EKG Interpretation   Date/Time:  Friday April 14 2014 02:02:04 EST Ventricular Rate:  103 PR Interval:  119 QRS Duration: 110 QT Interval:  345 QTC Calculation: 452 R Axis:   61 Text Interpretation:  Sinus tachycardia Probable left atrial enlargement  Anteroseptal infarct, age indeterminate No significant change since last  tracing Confirmed by POLLINA  MD, Melvin [phone]) on 04/14/2014  2:04:57 AM      MDM   Final diagnoses:  Chest pain  acute kidney injury UTI  Patient presents to the ER for multiple complaints. Patient comes from home with complaint of chest pain and associated shortness of breath. Patient indicates that the pain has been constant and there is discomfort in the epigastric region as well. Her cardiac workup thus far has been negative. Patient has not been eating or drinking. She has not been able to chew or swallow her food or drink any liquids. Patient has evidence of severe dehydration with acute kidney injury.urinalysis shows evidence of  infection. Urine culture has been sent.  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 She will require hospitalization for treatment of urinary tract infection and rehydration.    Orpah Greek, MD 04/14/14 [phone]

## 2014-04-14 NOTE — Progress Notes (Addendum)
MD notified due to acute change in patient status. Vital signs and labs are listed below.  MD notified(1st page) Time of 1st page:  Verlon Au Responding MD:  Verlon Au Time MD responded: 9:14 AM  MD response: MD aware  Vital Signs Filed Vitals:   04/14/14 0600 04/14/14 0630 04/14/14 0645 04/14/14 0837  BP: 147/107 142/99 140/103 146/107  Pulse:    96  Temp:    97.5 F (36.4 C)  TempSrc:    Oral  Resp: 14 18 16 20   SpO2:    99%     Lab Results WBC  Date/Time Value Ref Range Status  04/14/2014 02:17 AM 9.6 4.0 - 10.5 K/uL Final    Comment:    REPEATED TO VERIFY WHITE COUNT CONFIRMED ON SMEAR   09/09/2013 05:17 PM 5.3 4.0 - 10.5 K/uL Final    Comment:    WHITE COUNT CONFIRMED ON SMEAR  08/19/2012 07:14 PM 4.5 4.0 - 10.5 K/uL Final   NEUTROPHILS RELATIVE %  Date/Time Value Ref Range Status  04/14/2014 02:17 AM 83* 43 - 77 % Final  09/09/2013 05:17 PM 66 43 - 77 % Final  08/19/2012 07:14 PM 56 43 - 77 % Final   No results found for: PCO2ART No results found for: LATICACIDVEN No results found for: PCO2VEN   Cordelle Dahmen L, RN 04/14/2014, 8:44 AM

## 2014-04-14 NOTE — ED Notes (Signed)
Per [facility] - pt from home, lives w/ daughter - pt c/o chest pain that began yesterday morning, pt points to epigastrium upon questioning - pt w/ hx of terminal cervical cancer w/ mets to lungs. Pt w/ cool extremities and [facility] unable to obtain SPO2 en route - pt also states she is unable to chew and swallow meds - ASA not given d/t this. Per family at bedside pt also w/ decreased appetite at home x1 week. Pt also a DNR per daughter however unable to find paperwork at this time.

## 2014-04-14 NOTE — Progress Notes (Signed)
04/14/2014 1020 UR completed. Chart reviewed. Jonnie Finner RN CCM Case Mgmt phone [phone]

## 2014-04-14 NOTE — Care Management Note (Signed)
Page 1 of 2   04/18/2014     12:08:58 PM CARE MANAGEMENT NOTE 04/18/2014  Patient:  Christine Vincent, Christine Vincent   Account Number:  [id number]  Date Initiated:  04/14/2014  Documentation initiated by:  [facility]  Subjective/Objective Assessment:   metastatic cervical CA, pain     Action/Plan:   lives with dtr, Valrie Hart # [phone]   Anticipated DC Date:  04/18/2014   Anticipated DC Plan:  [place] referral  Clinical Social Worker      DC Planning Services  CM consult      [facility] Choice  HOME HEALTH   Choice offered to / List presented to:  C-4 Adult Children        [place] arranged  HH-1 RN  [place].   Status of service:  Completed, signed off Medicare Important Message given?   (If response is "NO", the following Medicare IM given date fields will be blank) Date Medicare IM given:   Medicare IM given by:   Date Additional Medicare IM given:  04/17/2014 Additional Medicare IM given by:  Sharolyn Douglas  Discharge Disposition:  [place]  Per UR Regulation:  Reviewed for med. necessity/level of care/duration of stay  If discussed at [place] of Stay Meetings, dates discussed:    Comments:  04/18/14 [place], BSN [phone] NCM spoke with Quenton Fetter with [facility] and she states they are refusing home hospice at this time, so now MD states patient is wanting to go home with [facility] services, referral given to NCM.  NCM spoke daughter and she states yes they want to go home with [place] who they had in the past they would like [facility], and aide, referral made to Sherrin Daisy notified, she went in to speak with daughter, she will have someone out to see them next week which is ok with daughter.  They did not need any DME , they already have DME at home.  Patient will need ambulance , [place] notified.  04/17/14 kristin mcnally Joann from [place] here to  meet with pt & family. Family not present. Family was made aware that hospice would be coming out to meet with them. Arville Go has tried several times to reach family and no call back. She will remain here on unit for a bit to see if family returns. CM will continue to follow case.   04/17/14 kristin mcnally received call from dr. Deitra Mayo palliative care at 1:30 pm. He called to discuss the plan that pt's dtr wanted pca dilaudid stopped and dtr wanted pt to trial po pain medication. Not optimal plan. Informed Dr. Deitra Mayo dtr was refusing to take pt home today because of the changes in pain mgmt that untimately family requested. Discussed this is difficult case and if family ends up refusing hospice care dr Deitra Mayo would be very hesitant to write an rx for pain medications. VM to Natalie at 3:30 pm to see if liason Joann from [place] had the chance to meet with family.  04/17/14 kristin mcnally 1 pm Met with Dr. Deitra Mayo at 11:30 to discuss case. he placed call to [facility] at hospice care of Home and discussed case as well. [facility] still at this point unable to state if they would accept case or give time frame to get back to this case manager. Call at 12 noon to  Margie at [facility] to state we will cancel consult and look at another hospice. Call to [facility], and per [place] they cannot accept case as they do not go to that area of [place]. Discussed another hospice choice with dtr Elisa and choice given for [facility] and spoke with natalie. Per Oneal Deputy will be out today to meet with family. Elisa/snursing made aware. Dtr now would like pca diluadid stopped and wants pt to try oral pain medications. Dtr states she will not take pt home today until she see's how pain medication is working. Will continue to follow case and follow up with [facility] after they meet with family.   04/17/14 kristin mcnally Met with pt's dtr Elisa in  room to discuss hopsice. Dtr would like home hospice and states she thinks pt had [facility] in the past and currently all the DME in home she believes is from them (hospital bed, 02, bsc, wc, rw). I asked the dtr if they did not want to use this hospice angency as it was reflected in my consult. All choices of hospice companies given in [facility]. Daughter states she would like first choice of hospice and palliative care of [place] if they would accept pt back and second choice is [facility] out of [place]. Call to [facility] at [place] and discussed case. She states she will call office to see if pt was previously on service and if they can accept the case. Margie made aware of above and also pt will need home pca diluadid. Will also need to set up ambulance transport home for pt to [address] . Will cont to follow and await call back from [place] at hospice.   04/17/14 Kristin mcnally IM given 04/17/14. Will meet with dtr elisa to discuss home with hospice.   04/14/2014 1020 UR completed. Chart reviewed. Jonnie Finner RN CCM Case Mgmt phone [phone]

## 2014-04-14 NOTE — Progress Notes (Signed)
Dr Deitra Mayo notified that clarification of the hydromorphone gtt orders. New orders received will continue to monitor Arthor Captain LPN

## 2014-04-14 NOTE — H&P (Signed)
[facility] History and Physical  Christine Vincent OIZ:[id number] DOB: 1951-02-17 DOA: 04/14/2014  Referring physician: ER physician. PCP: Eyvonne Mechanic, MD  Chief Complaint: Chest pain.  Most of the history is obtained from patient's daughter, ER physician and care everywhere. Patient is quite lethargic.  HPI: Christine Vincent is a 63 y.o. female with history of AIDS who is presently not on any medications, hepatitis C, cervical cancer, bilateral hydronephrosis who usually gets treated at [facility] was brought to the ER after patient was complaining of increasing chest pain over the last 3 days with shortness of breath in the ER EKG showing sinus tachycardia with cardiac markers negative. Patient's labs show worsening anemia and renal function with acidosis. At this time patient's daughter stated that patient's general condition has been gradually worsening and has requested only comfort measures and no aggressive workup or labs.   Review of Systems: As presented in the history of presenting illness, rest negative.  Past Medical History  Diagnosis Date  . AIDS   . Neuropathy   . Herpes   . Toxoplasmosis   . HIV disease   . Herpes   . Neuropathy   . Kidney mass   . Hepatitis C virus   . Vaginal venereal warts   . Cervical cancer   . Renal cell carcinoma     left kidney; presumable   History reviewed. No pertinent past surgical history. Social History:  reports that she has been smoking Cigarettes.  She has been smoking about 0.50 packs per day. She has never used smokeless tobacco. She reports that she uses illicit drugs ("Crack" cocaine). She reports that she does not drink alcohol. Where does patient live home. Can patient participate in ADLs? No.  Allergies  Allergen Reactions  . Adhesive [Tape] Itching and Other (See Comments)    "Burns skin"  . Aspirin Hives, Itching and Nausea And Vomiting  . Pork-Derived Metallurgist and Other (See Comments)    Family History:   Family History  Problem Relation Age of Onset  . Hypertension Maternal Grandmother   . Cancer Maternal Grandmother     lung ca  . Stroke Maternal Grandmother       Prior to Admission medications   Medication Sig Start Date End Date Taking? Authorizing Provider  Bisacodyl (LAXATIVE PO) Take 15 mLs by mouth daily.   Yes Historical Provider, MD  morphine (MS CONTIN) 60 MG 12 hr tablet Take 60 mg by mouth 2 (two) times daily as needed for pain.   Yes Historical Provider, MD  ondansetron (ZOFRAN) 4 MG tablet Take 4 mg by mouth every 8 (eight) hours as needed for nausea or vomiting.   Yes Historical Provider, MD  oxycodone (ROXICODONE) 30 MG immediate release tablet Take 30 mg by mouth every 4 (four) hours as needed for pain.   Yes Historical Provider, MD  sulfamethoxazole-trimethoprim (BACTRIM DS,SEPTRA DS) 800-160 MG per tablet Take 1 tablet by mouth daily.   Yes Historical Provider, MD  traMADol (ULTRAM) 50 MG tablet Take 50 mg by mouth every 6 (six) hours as needed (for headache).    Yes Historical Provider, MD  levofloxacin (LEVAQUIN) 250 MG tablet Take 3 tablets (750 mg total) by mouth daily. 09/09/13   Christine Sciacca, PA-C  oxyCODONE (ROXICODONE) 15 MG immediate release tablet Take 30 mg by mouth every 3 (three) hours as needed for pain.    Historical Provider, MD  OxyCODONE HCl ER (OXYCONTIN) 60 MG T12A Take 60 mg by mouth every 12 (twelve) hours.  Historical Provider, MD    Physical Exam: Filed Vitals:   04/14/14 0515 04/14/14 0530 04/14/14 0545 04/14/14 0600  BP: 129/92 127/99 134/95 147/107  Pulse:      Temp:      TempSrc:      Resp: 13 13 13 14   SpO2:         General:  Poorly nourished and built.  Eyes: anicteric no pallor.  ENT: no discharge from the ears eyes nose mouth.  Neck: no mass felt.  Cardiovascular: S1-S2 heard.  Respiratory: no rhonchi or crepitations.  Abdomen: soft nontender bowel sounds present.  Skin: chronic skin changes.  Musculoskeletal:  no edema.  Psychiatric: patient is lethargic.  Neurologic: patient is lethargic.  Labs on Admission:  Basic Metabolic Panel:  Recent Labs Lab 04/14/14 0217  NA 134*  K 4.8  CL 99  CO2 17*  GLUCOSE 71  BUN 32*  CREATININE 2.02*  CALCIUM 10.4   Liver Function Tests:  Recent Labs Lab 04/14/14 0217  AST 14  ALT 6  ALKPHOS 230*  BILITOT 0.4  PROT 9.2*  ALBUMIN 2.0*    Recent Labs Lab 04/14/14 0217  LIPASE 11   No results for input(s): AMMONIA in the last 168 hours. CBC:  Recent Labs Lab 04/14/14 0217  WBC 9.6  NEUTROABS 7.9*  HGB 7.9*  HCT 25.6*  MCV 68.6*  PLT PLATELET CLUMPS NOTED ON SMEAR, COUNT APPEARS DECREASED   Cardiac Enzymes:  Recent Labs Lab 04/14/14 0217  TROPONINI <0.30    BNP (last 3 results)  Recent Labs  04/14/14 0217  PROBNP 1200.0*   CBG: No results for input(s): GLUCAP in the last 168 hours.  Radiological Exams on Admission: Dg Chest Port 1 View  04/14/2014   CLINICAL DATA:  Chest pain.  EXAM: PORTABLE CHEST - 1 VIEW  COMPARISON:  PA and lateral chest 09/09/2013 and 08/19/2012.  FINDINGS: The lungs appear emphysematous but are clear. No pneumothorax or pleural effusion is identified. Heart size is normal. No focal bony abnormality is identified.  IMPRESSION: Emphysema without acute disease.   Electronically Signed   By: Inge Rise M.D.   On: 04/14/2014 02:59    EKG: Independently reviewed. Sinus tachycardia.  Assessment/Plan Principal Problem:   Chest pain Active Problems:   Acute renal failure   AIDS   Microcytic anemia   UTI (lower urinary tract infection)   1. Chest pain. 2. AIDS. 3. Acute renal failure. 4. Metabolic acidosis probably from renal failure. 5. History of bilateral hydronephrosis. 6. Microcytic anemia. 7. UTI. 8. History of cervical cancer. 9. Severe protein calorie malnutrition.  Plan - at this time patient's daughter has requested only comfort measures and no aggressive workup and  labs. Patient's daughter states that it's okay to continue antibiotics for UTI. Gentle hydration. I have consulted palliative care for goals of care. Patient is a DO NOT RESUSCITATE.Looking back at patient's [facility] at [facility] carry everywhere patient has previously declined any antiretroviral therapy and also patient has had known history of bilateral hydronephrosis for which she has not pursued further care.    Code Status: DO NOT RESUSCITATE.  Family Communication: patient's daughter at the bedside.  Disposition Plan: admit to inpatient    [facility] Pager [phone].  If 7PM-7AM, please contact night-coverage [email or web address] Password [email or web address] 04/14/2014, 6:49 AM

## 2014-04-14 NOTE — Progress Notes (Signed)
Patient expressing that she is in pain. MD [place] notified and ordered to bolus patient with 1mg  of IV dilaudid PRN until PCA pump is set up. Will continue to monitor.

## 2014-04-14 NOTE — ED Notes (Signed)
Respiratory Tech phoned to obtain a arterial blood gas.

## 2014-04-14 NOTE — Progress Notes (Addendum)
Report received from ED nurse Lilia Pro for patient to be admitted into 5w03

## 2014-04-14 NOTE — Progress Notes (Signed)
NURSING PROGRESS NOTE  Christine Vincent [id number] Admission Data: 04/14/2014 8:43 AM Attending Provider: Nita Sells, MD [facility]:[email or web address],UMPN, MD Code Status: FULL  Christine Vincent is a 63 y.o. female patient admitted from ED:  -No acute distress noted.  -No complaints of shortness of breath.  -No complaints of chest pain.    Blood pressure 146/107, pulse 96, temperature 97.5 F (36.4 C), temperature source Oral, resp. rate 20, SpO2 99 %.   IV Fluids:  IV in place, occlusive dsg intact without redness, IV cath hand right, condition patent and no redness  Allergies:  Adhesive; Aspirin; and Pork-derived products  Past Medical History:   has a past medical history of AIDS; Neuropathy; Herpes; Toxoplasmosis; HIV disease; Herpes; Neuropathy; Kidney mass; Hepatitis C virus; Vaginal venereal warts; Cervical cancer; and Renal cell carcinoma.  Past Surgical History:   has no past surgical history on file.  Skin: intact. Small discolored area to right buttocks  Patient/Family orientated to room. Information packet given to patient/family. Admission inpatient armband information verified with patient/family to include name and date of birth and placed on patient arm. Side rails up x 2, fall assessment and education completed with patient/family. Patient/family able to verbalize understanding of risk associated with falls and verbalized understanding to call for assistance before getting out of bed. Call light within reach. Patient/family able to voice and demonstrate understanding of unit orientation instructions.    Will continue to evaluate and treat per MD orders.  Wallie Renshaw, RN

## 2014-04-14 NOTE — Progress Notes (Signed)
2:15 PM I agree with HPI/GPe and A/P per Dr. Hal Hope  Poor situation Patient not in overt pain but moaning and groaning and alert Daughter upset and wishes transfer to [facility] and they are on a case-by-case basis right now and as patient doesn;t have any sub-specialty needs, have declined transfer  Patient has been seen by Palliative care and they will continue to monitor      HEENT cachectic, faril.  intelligble  CHEST clear no added soudn CARDIAC s1 s 2no m/r/g ABDOMEN slighlty tender RLQ, no rebound  Patient Active Problem List   Diagnosis Date Noted  . Chest pain 04/14/2014  . Acute renal failure 04/14/2014  . AIDS 04/14/2014  . Microcytic anemia 04/14/2014  . UTI (lower urinary tract infection) 04/14/2014  . Palliative care encounter   . Cervical cancer   . Cancer related pain   . Loss of appetite   . Cervical ca 07/16/2012  . Neuropathy   . Herpes   . Toxoplasmosis    Verneita Griffes, MD [facility] [phone]

## 2014-04-14 NOTE — Progress Notes (Signed)
MD informed of infusion rate not tolerated with SUB Q access. Orders to d/c fluids.

## 2014-04-15 DIAGNOSIS — N171 Acute kidney failure with acute cortical necrosis: Secondary | ICD-10-CM

## 2014-04-15 DIAGNOSIS — C539 Malignant neoplasm of cervix uteri, unspecified: Secondary | ICD-10-CM

## 2014-04-15 DIAGNOSIS — R630 Anorexia: Secondary | ICD-10-CM

## 2014-04-15 MED ORDER — POLYETHYLENE GLYCOL 3350 17 G PO PACK
17.0000 g | PACK | Freq: Every day | ORAL | Status: DC
  Filled 2014-04-15 (×2): qty 1

## 2014-04-15 MED ORDER — CETYLPYRIDINIUM CHLORIDE 0.05 % MT LIQD
7.0000 mL | Freq: Two times a day (BID) | OROMUCOSAL | Status: DC
  Administered 2014-04-17 – 2014-04-18 (×2): 7 mL via OROMUCOSAL

## 2014-04-15 NOTE — Social Work (Signed)
LCSW met with patient's daughter while patient was asleep in room. Patient daughter stated that she has not been eating since she has been here with her mother.  Patient stated that she also does not have transportation home in order to get something to eat. LCSW will provide a meal pass for patient's daughter. No further CSW needs.  Christene Lye MSW, [place]

## 2014-04-15 NOTE — Progress Notes (Signed)
4.5 ml of Dilaudid IV  Wasted in sink with Facilities manager

## 2014-04-15 NOTE — Progress Notes (Signed)
INITIAL NUTRITION ASSESSMENT  DOCUMENTATION CODES Per approved criteria  -Severe malnutrition in the context of chronic illness   Pt meets criteria for severe  MALNUTRITION in the context of chronic illness as evidenced by severe muscle wasting to anterior thigh, patella, clavicles and fat mass depletion (upper arms and thoracic regions).  INTERVENTION:  Comfort feeding    NUTRITION DIAGNOSIS: Inadequate oral intake related to anorexia as evidenced by severe muscle wasting and meal intake records pt taking bites only and sips of fluid.   Goal is comfort: Meet nutrition needs as able given pt terminal illness (actively dying) per MD  Monitor:  Po intake, labs and wt trends   Reason for Assessment: Malnutrition Screen   63 y.o. female  Admitting Dx: Chest pain  ASSESSMENT: Pt is a 63 yo FM who has metastatic cervical CA, bilateral hydronephrosis, HIV, Hep C. Pt unable to provide meaningful nutrition hx. Palliative team following Hx also includes  drug abuse and previous admissions to Hospice care. Cancer related pain. IVF and ABT have been d/c.  Nutrition Focused Physical Exam:  Subcutaneous Fat:  Orbital Region: severe depletion Upper Arm Region: severe depletion Thoracic and Lumbar Region: severe depletion  Muscle:  Temple Region: moderate-severe wasting Clavicle Bone Region: severe wasting Clavicle and Acromion Bone Region: severe wasting Scapular Bone Region: severe wasting Dorsal Hand: moderate wasting Patellar Region: severe wasting Anterior Thigh Region: severe wasting Posterior Calf Region: severe wasting  Edema: none noted    Height: Ht Readings from Last 1 Encounters:  No data found for Ht    Weight: Wt Readings from Last 1 Encounters:  08/19/12 120 lb (54.432 kg)  Current wt_???   Wt Readings from Last 10 Encounters:  08/19/12 120 lb (54.432 kg)    BMI:  There is no height or weight on file to calculate BMI.  Skin: no issues noted  Diet  Order: Diet regular  EDUCATION NEEDS: -No education needs identified at this time   Intake/Output Summary (Last 24 hours) at 04/15/14 1141 Last data filed at 04/15/14 1013  Gross per 24 hour  Intake    100 ml  Output    725 ml  Net   -625 ml    Last BM: PTA Labs:   Recent Labs Lab 04/14/14 0217  NA 134*  K 4.8  CL 99  CO2 17*  BUN 32*  CREATININE 2.02*  CALCIUM 10.4  GLUCOSE 71    CBG (last 3)  No results for input(s): GLUCAP in the last 72 hours.  Scheduled Meds: . antiseptic oral rinse  7 mL Mouth Rinse BID  . polyethylene glycol  17 g Oral Daily    Continuous Infusions: . HYDROmorphone      Past Medical History  Diagnosis Date  . AIDS   . Neuropathy   . Herpes   . Toxoplasmosis   . HIV disease   . Herpes   . Neuropathy   . Kidney mass   . Hepatitis C virus   . Vaginal venereal warts   . Cervical cancer   . Renal cell carcinoma     left kidney; presumable    History reviewed. No pertinent past surgical history.  Colman Cater MS,RD,CSG,LDN Office: [phone] Pager: [phone]

## 2014-04-15 NOTE — Progress Notes (Signed)
Christine Vincent IOX:[id number] DOB: 1950/12/01 DOA: 04/14/2014 PCP: Christine Mechanic, MD  Brief narrative: 63 year old lady with HIV not under treatment, metastatic cervical cancer, bilateral hydronephrosis, history substance abuse and cancer pain/cachexia admitted with lethargy and weakness  Past medical history-As per Problem list Chart reviewed as below- review  Consultants:  Palliative medicine  Procedures:  none  Antibiotics:  none   Subjective  Sleeping in bed Not really eating according to daughter Christine Vincent states pain is fair mainly in abdomen No nausea no vomiting Hasn't passed stool   Objective    Interim History: none  Telemetry: none   Objective: Filed Vitals:   04/14/14 2050 04/15/14 0554 04/15/14 1252 04/15/14 1322  BP: 149/107 130/99  146/102  Pulse: 102 101  97  Temp:  97.2 F (36.2 C)  96.8 F (36 C)  TempSrc:  Axillary  Axillary  Resp:  20  16  Height:   5\' 5"  (1.651 m)   Weight:   32.659 kg (72 lb)   SpO2: 93% 94%  100%    Intake/Output Summary (Last 24 hours) at 04/15/14 1601 Last data filed at 04/15/14 1400  Gross per 24 hour  Intake    100 ml  Output    425 ml  Net   -325 ml    Exam:  General:  Alert and mild pain, cachectic Cardiovascular: S1-S2 no murmur rub or gallop Respiratory: clear Abdomen: soft nontender Skinno lower extremity edema Neurocontact  Data Reviewed: Basic Metabolic Panel:  Recent Labs Lab 04/14/14 0217  NA 134*  K 4.8  CL 99  CO2 17*  GLUCOSE 71  BUN 32*  CREATININE 2.02*  CALCIUM 10.4   Liver Function Tests:  Recent Labs Lab 04/14/14 0217  AST 14  ALT 6  ALKPHOS 230*  BILITOT 0.4  PROT 9.2*  ALBUMIN 2.0*    Recent Labs Lab 04/14/14 0217  LIPASE 11   No results for input(s): AMMONIA in the last 168 hours. CBC:  Recent Labs Lab 04/14/14 0217  WBC 9.6  NEUTROABS 7.9*  HGB 7.9*  HCT 25.6*  MCV 68.6*  PLT PLATELET CLUMPS NOTED ON SMEAR, COUNT APPEARS DECREASED   Cardiac  Enzymes:  Recent Labs Lab 04/14/14 0217  TROPONINI <0.30   BNP: Invalid input(s): POCBNP CBG: No results for input(s): GLUCAP in the last 168 hours.  Recent Results (from the past 240 hour(s))  Urine culture     Status: None (Preliminary result)   Collection Time: 04/14/14  4:32 AM  Result Value Ref Range Status   Specimen Description URINE, RANDOM  Final   Special Requests ADDED [id number]  Final   Culture  Setup Time   Final    04/14/2014 13:00 Performed at [facility]   Final    >=100,000 COLONIES/ML Performed at [facility]    Culture   Final    ESCHERICHIA COLI Performed at [facility]    Report Status PENDING  Incomplete     Studies:              All Imaging reviewed and is as per above notation   Scheduled Meds: . antiseptic oral rinse  7 mL Mouth Rinse BID  . polyethylene glycol  17 g Oral Daily   Continuous Infusions: . HYDROmorphone 1.5 mg/hr (04/15/14 1540)     Assessment/Plan: 1. HIV 2. Metastatic cervical cancer 3. Cancer cachexia 4. Intractable pain    Very poor prognosis. Currently on subcutaneous Dilaudid drip. I  appreciate Dr. Jeanice Lim input into her care. Disposition difficult to we will try to assist in any way possible  Code Status: DO NOT RESUSCITATE Family Communication: discussed with daughter at bedside Disposition Plan: inpatient   Verneita Griffes, MD  [facility] Pager [phone] 04/15/2014, 4:01 PM    LOS: 1 day

## 2014-04-15 NOTE — Progress Notes (Signed)
Patient GD:Christine Vincent      DOB: [birth year]      STM:[id number]   Palliative Medicine Team at [facility] Progress Note    Subjective: Still having some pain. Difficult for her to quantify or even tell me where it is.  She wants to go home.  We negotiated potential hospice care, but her brother Christine Vincent is opposed.  Still very lethargic.  A little more awake today but still appears to be doing very poorly.     Filed Vitals:   04/15/14 0554  BP: 130/99  Pulse: 101  Temp: 97.2 F (36.2 C)  Resp: 20   Physical exam: GEN: lethagic, cachectic GX:QJJHERD Rate Lungs: CTAB EXT: no edema    Assessment and plan: 63 yo female with HIV, metastatic cervical CA, bilateral hydronephrosis 2/2 malignancy (refused neph tubes), h/o substance abuse and difficult to control cancer related pain who presented with lethargy, weakness of several days duration.   1. Code Status: DNR  2. Goals of Care:  See initial consult.  Christine Vincent wants to go home. Family hesistent and do not think they can care for her at home. I still think IV/SQ pain medicine is best care for her.  I do not think abx adding much at this point as she is dying from her cancer.  I talked with her brother Christine Vincent over phone and daughter at bedside.  Christine Vincent agrees to hospice care with a different agency besides [facility] (whom she has poor relationship with).  Family would like to wait until tomorrow when Christine Vincent will be here. I will plan on meeting up with them at some point tomorrow.  I think residential hospice would be best, but Christine Vincent refuses.  It is possible to do PCA at home through hospice and we may be able to negotiate this.     3. Symptom Management:  1. Cancer Related Pain: I am not sure what happened with her orders overnight. I got a call saying that their was a titration order placed on her dialudid infusion (which I did not place). Somehow her infusion rate got up to 38m/hr for a time as well?  Right now her basal  infusion should be 1.519mhr with 1.71m54molus PRN q30 min.  If we are exceeding much over 3ml12m in subQ site, second site will need to be placed. Please call me directly with problems surrounding her pain management.   2. Anorexia/cachexia: Related to advanced disease and heading toward dying process. Oral care. Do not think meds will be beneficial 3. Bilateral Hydronephrosis/?UTI- IV access lost yesterday. With goal of comfort I stopped IV fluids.  I will d/c abx today as I do not think infection playing major role, as cancer is likely driving her dying process. Goal of comfort.   4. Psychosocial/Spiritual: Very challenging situation. Christine Vincent has had a difficult life. She often has had conflict in all her personal relationships (even with family). The first time I met her, she kicked me out of the room and would only see our Palliative Fellow. Her poor social situation has been complicated by drug abuse. She was with several different hospice agencies and discharged due to difficulty in her care and compliance. [place] to see family. Now back in [place] past few months and had been [facility] always coped poorly with her disease. Had been staying at home with her daughter and support from her brother Christine Vincent.   Total Time: 60 minutes   >  50% of time spent in counseling and coordination of care regarding above assessment and plan  Doran Clay D.O. Palliative Medicine Team at [facility]  Pager: [phone] Team Phone: [phone]

## 2014-04-15 NOTE — Progress Notes (Signed)
Replaced hydromorphone 25mg  in NS bag for patient. Wasted 4.3mL of hydromorphone 25mg  in NS from previous bag into sink with Audria Nine, RN as witness.

## 2014-04-16 DIAGNOSIS — Z7189 Other specified counseling: Secondary | ICD-10-CM | POA: Insufficient documentation

## 2014-04-16 LAB — URINE CULTURE: Colony Count: >=100000

## 2014-04-16 NOTE — Progress Notes (Signed)
Wasted approximately 18 mL of Dilaudid from continuous infusion bag with R. L'[place], RN.

## 2014-04-16 NOTE — Progress Notes (Signed)
Christine Vincent IRW:[id number] DOB: [birth year] DOA: 04/14/2014 PCP: Eyvonne Mechanic, MD  Brief narrative: 63 year old lady with HIV not under treatment, metastatic cervical cancer, bilateral hydronephrosis, history substance abuse and cancer pain/cachexia admitted with lethargy and weakness  Past medical history-As per Problem list Chart reviewed as below- review  Consultants:  Palliative medicine  Procedures:  none  Antibiotics:  none   Subjective   minimally responsive.  Seen in room with Palliative Md and her sone + No spontaneous c/o   Objective    Interim History: none  Telemetry: none   Objective: Filed Vitals:   04/15/14 1322 04/15/14 2142 04/16/14 0509 04/16/14 1436  BP: 146/102 150/109 151/108 140/113  Pulse: 97 95 101 109  Temp: 96.8 F (36 C) 97.2 F (36.2 C) 97.7 F (36.5 C) 97.4 F (36.3 C)  TempSrc: Axillary Axillary Axillary Oral  Resp: 16 12 18 16   Height:      Weight:      SpO2: 100% 100% 100% 100%    Intake/Output Summary (Last 24 hours) at 04/16/14 1450 Last data filed at 04/16/14 1400  Gross per 24 hour  Intake  119.5 ml  Output    300 ml  Net -180.5 ml    Exam:  General:  Alert and mild pain, cachectic  Data Reviewed: Basic Metabolic Panel:  Recent Labs Lab 04/14/14 0217  NA 134*  K 4.8  CL 99  CO2 17*  GLUCOSE 71  BUN 32*  CREATININE 2.02*  CALCIUM 10.4   Liver Function Tests:  Recent Labs Lab 04/14/14 0217  AST 14  ALT 6  ALKPHOS 230*  BILITOT 0.4  PROT 9.2*  ALBUMIN 2.0*    Recent Labs Lab 04/14/14 0217  LIPASE 11   No results for input(s): AMMONIA in the last 168 hours. CBC:  Recent Labs Lab 04/14/14 0217  WBC 9.6  NEUTROABS 7.9*  HGB 7.9*  HCT 25.6*  MCV 68.6*  PLT PLATELET CLUMPS NOTED ON SMEAR, COUNT APPEARS DECREASED   Cardiac Enzymes:  Recent Labs Lab 04/14/14 0217  TROPONINI <0.30   BNP: Invalid input(s): POCBNP CBG: No results for input(s): GLUCAP in the last 168  hours.  Recent Results (from the past 240 hour(s))  Urine culture     Status: None (Preliminary result)   Collection Time: 04/14/14  4:32 AM  Result Value Ref Range Status   Specimen Description URINE, RANDOM  Final   Special Requests ADDED [id number]  Final   Culture  Setup Time   Final    04/14/2014 13:00 Performed at [facility]   Final    >=100,000 COLONIES/ML Performed at [facility]    Culture   Final    ESCHERICHIA COLI Performed at [facility]    Report Status PENDING  Incomplete     Studies:              All Imaging reviewed and is as per above notation   Scheduled Meds: . antiseptic oral rinse  7 mL Mouth Rinse BID   Continuous Infusions: . HYDROmorphone 1.5 mg/hr (04/16/14 0737)     Assessment/Plan: 1. HIV 2. Metastatic cervical cancer 3. Cancer cachexia 4. Intractable pain    Very poor prognosis. Currently on subcutaneous Dilaudid drip. I appreciate Dr. Jeanice Lim input into her care.  Main issue now is disposition-It is very likely her prognosis is within the day-week range   Code Status: DO NOT RESUSCITATE Family Communication: discussed with daughter at bedside  Disposition Plan: inpatient   Verneita Griffes, MD  [facility] Pager [phone] 04/16/2014, 2:50 PM    LOS: 2 days

## 2014-04-16 NOTE — Plan of Care (Signed)
Problem: Phase II Progression Outcomes Goal: Pain within acceptable level for patient Outcome: Completed/Met Date Met:  04/16/14 Goal: Non-pain symptoms managed Outcome: Completed/Met Date Met:  04/16/14

## 2014-04-16 NOTE — Progress Notes (Signed)
Hydromorphone 25mg  in NS bag appeared near empty. New bag hung, and 60mL of hydromorphone 25mg  in NS (previous bag) wasted in sink with Karolee Stamps, RN.

## 2014-04-16 NOTE — Progress Notes (Signed)
RN spoke with daughter regarding patient's care while hospitalized. Daughter encouraged to call out for assistance using phone or call bell if patient had any needs. Daughter stated that she had been emptying patient's foley catheter. RN asked that daughter allow staff to empty as it needs to be measured. Daughter verbalized understanding and agreement. Will continue to monitor.

## 2014-04-16 NOTE — Progress Notes (Signed)
Patient Christine Vincent      DOB: Jun 09, 1950      MVE:[id number]   Palliative Medicine Team at [facility] Progress Note    Subjective: Caraline continues to have severe pain at times in abdomen. Overall appears more comfortable. Hardly any PO intake.  Brother and son at bedside.      Filed Vitals:   04/16/14 0509  BP: 151/108  Pulse: 101  Temp: 97.7 F (36.5 C)  Resp: 18   Physical exam: GEN: lethagic, cachectic. Able to respond appropriately and briefly to questions.   GE:ZMOQH, regular Lungs: CTAB EXT: no edema     Assessment and plan: 63 yo female with HIV, metastatic cervical CA, bilateral hydronephrosis 2/2 malignancy (refused neph tubes), h/o substance abuse and difficult to control cancer related pain who presented with lethargy, weakness of several days duration.   1. Code Status: DNR  2. Goals of Care:  See previous documentation.  I met for a long time with her brother Justice and Son while in [place] room.  Goal of this meeting was where to go from here, but Justice expressed concerns around pain medications.  He shared concerns over pain medications making her too sleepy. Despite this, Terilyn is more alert than when she came in (albeit slightly and still actively dying) and still complains of pain.  Justice wanted her switched back to pain pills based off how much IV dilaudid she was using.  I did my best to explain in laymens terms how the pharmacology of pills would almost certainly lead to either more sedation or worse pain control and likely a fluctuation between both. I voiced my strong opposition to this for the reason of worsening her suffering and end of life. 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  I informed Justice though, that I would comply with family request if Luzelena agreed  that she would rather be on pain pills than subQ pump.  I allowed Justice to ask Loella these questions and she clearly stated no.  We talked more extensively about Mischelle's other issues with renal failure, tumor causing bilateral severe hydronephrosis, etc.  Justice persisted on wanting to back down on pain meds but after seeing Wilna having periods of grimacing pain even during our conversation, he finally agreed that we should continue dilaudid infusion as is.  I told justice that if he still insisted on decreasing pain meds to see if this improved her weakness, than he needed to be here for at least the next 6hrs to see how she responded to this.  Ultimately, Priseis wants to go home and Justice agree's that she needs care and someone watching her pain and EOL management. He knows she will not agree to hospice facility and wants to go home.  He was okay with me reaching out to case management to see what options are available for home hospice.  They have bad experience with [facility] [place] [facility] and we will need to use different agency. I will continue to follow along.    3. Symptom Management:  1. Cancer Related Pain: see above. Will continue current dilaudid settings of 1.58m/hr an 1.540mPRN 2. Anorexia/cachexia: Related to advanced disease and heading toward dying process. Oral care. Do not think meds will be beneficial  4. Psychosocial/Spiritual: Very challenging situation. LaNimorankly has  had a difficult life. She often has had conflict in all her personal relationships (even with family). The first time I met her, she kicked me out of the room and would only see our Palliative Fellow. Her poor social situation has been complicated by drug abuse. She was with several different hospice agencies and discharged due to difficulty in her care and compliance. Recently moved to [place] for couple of months to see family. Now back in [place] for past few months and had been following back at  [facility]. Has always coped poorly with her disease. Had been staying at home with her daughter and support from her brother Justice.   Total Time: 70 minutes  >50% of time spent in counseling and coordination of care regarding above assessment and plan  Doran Clay D.O. Palliative Medicine Team at [facility]  Pager: [phone] Team Phone: [phone]

## 2014-04-17 DIAGNOSIS — R0789 Other chest pain: Secondary | ICD-10-CM

## 2014-04-17 DIAGNOSIS — B029 Zoster without complications: Secondary | ICD-10-CM | POA: Insufficient documentation

## 2014-04-17 MED ORDER — VALACYCLOVIR HCL 500 MG PO TABS: 500.0000 mg | ORAL_TABLET | Freq: Every day | ORAL | Status: AC

## 2014-04-17 MED ORDER — VALACYCLOVIR HCL 500 MG PO TABS
500.0000 mg | ORAL_TABLET | Freq: Every day | ORAL | Status: DC
  Administered 2014-04-17 – 2014-04-18 (×2): 500 mg via ORAL
  Filled 2014-04-17 (×2): qty 1

## 2014-04-17 MED ORDER — HYDROMORPHONE HCL 1 MG/ML IJ SOLN
1.0000 mg | INTRAMUSCULAR | Status: DC | PRN
  Administered 2014-04-18: 1 mg via SUBCUTANEOUS
  Filled 2014-04-17: qty 1

## 2014-04-17 MED ORDER — OXYCODONE HCL ER 40 MG PO T12A
60.0000 mg | EXTENDED_RELEASE_TABLET | Freq: Three times a day (TID) | ORAL | Status: DC
  Administered 2014-04-17 – 2014-04-18 (×3): 60 mg via ORAL
  Filled 2014-04-17 (×2): qty 2
  Filled 2014-04-17 (×3): qty 1
  Filled 2014-04-17: qty 2

## 2014-04-17 MED ORDER — DEXAMETHASONE 1 MG/ML PO CONC
4.0000 mg | Freq: Every day | ORAL | Status: DC
  Administered 2014-04-17 – 2014-04-18 (×2): 4 mg via ORAL
  Filled 2014-04-17 (×2): qty 4

## 2014-04-17 MED ORDER — SODIUM CHLORIDE 0.9 % IV SOLN: 1.5000 mg/h | INTRAVENOUS | Status: DC

## 2014-04-17 MED ORDER — OXYCODONE HCL 5 MG PO TABS
30.0000 mg | ORAL_TABLET | ORAL | Status: DC | PRN
  Administered 2014-04-17 – 2014-04-18 (×2): 30 mg via ORAL
  Filled 2014-04-17 (×2): qty 6

## 2014-04-17 NOTE — Progress Notes (Signed)
Wasted 15mg  Dilauded with Alvis Lemmings, RN.

## 2014-04-17 NOTE — Progress Notes (Signed)
Spoke with Dr. Deitra Mayo regarding single dermatome herpetic rash, starting to crust. Started on valtrex. Patient does not have other dermatomal involvement, no signs of dissemination with other organ system involvement.  For now can keep on contact precautions. Plus recommendation to keep affected area covered with simple dressing  If questions, please contact me for consultation  Caren Griffins B. [facility] [phone]

## 2014-04-17 NOTE — Consult Note (Signed)
Visited bedside to find pt extremely weak.  Pt attempts to respond to this RN verbally but appears too weak to speak.  Pt has a very blank stare and is clearly not able to direct her own care decisions.  No family at bedside.  Placed a call to [place].  No answer and she has yet to return my phone call.  Will continue to try and reach family to discuss [facility] services.  Updated CM Cyril Mourning and [employer].  Thank you for referral.   Wynetta Fines, RN

## 2014-04-17 NOTE — Progress Notes (Addendum)
Patient ZO:XWRUE [place]      DOB: 1950-10-14      AVW:[id number]   Palliative Medicine Team at [facility] Progress Note    Subjective: Lethargic, still in pain. Family wanting EKG.  With EKG, herpetic rash along dermatomal pattern below nipple radiating into back.  Severely painful for [place].  Also continues to have abdominal pain.        Filed Vitals:   04/17/14 0500  BP: 141/96  Pulse: 106  Temp: 98.6 F (37 C)  Resp: 12   Physical exam: GEN: lethargic, appears uncomfortable HEENT: [place], sclera anicteric CV: tachy LUNGS: CTAB ABD: soft, diffusely tender EXT: no edmea, cachectic appearing Skin: herpetic rash along T5 or T6 dermatome radiating into back. Does not cross midline.       Assessment and plan: 63 yo female with HIV, metastatic cervical CA, bilateral hydronephrosis 2/2 malignancy (refused neph tubes), h/o substance abuse and difficult to control cancer related pain who presented with lethargy, weakness of several days duration.   1. Code Status: DNR  2. Goals of Care:  Spent extensive amount of time with family again today. They requested EKG for chest pain. Patch of skin ripped off when EKG sticker removed.  They then declined EKG.  Discovered herpetic rash with EKG though.  Extensive time spent with daughter again explaining rational for IV pain med infusion.  Re-explained with brother Justice today as well. Family seems to finally agree that going home with hospice is best way to honor Loreal's wishes. Case manager consult placed.  Hopefully we can get home today.     3. Symptom Management:  1. Cancer Related Pain: see above. Will continue current dilaudid settings of 1.77m/hr an 1.538mPRN 2. Anorexia/cachexia: Related to advanced disease and heading toward dying process. Oral care. Do not think meds will be beneficial 3. Shingles- will start renally dosed valacyclovir. Steroids. Appreciate nursing placing on contact precautions.    4.  Psychosocial/Spiritual: Very challenging situation. LaAllanrankly has had a difficult life. She often has had conflict in all her personal relationships (even with family). The first time I met her, she kicked me out of the room and would only see our Palliative Fellow. Her poor social situation has been complicated by drug abuse. She was with several different hospice agencies and discharged due to difficulty in her care and compliance. [place] [facility] Had been staying at home with her daughter and support from her brother Justice.   Total Time: 45 minutes  >50% of time spent in counseling and coordination of care regarding above assessment and plan   ADDENDUM:  Family again insistent on taking LaLiself dilaudid infusion and back to home regimen MS Contin and PRN oxycodone. With shingles dx, they are convinced pain will now be okay.  I have spent hours today and on weekend working with family on these issues and her end of life care.  After what I fear to be much coercion from family, Lajatara huettnero coming off PCA.  I am not sure she can swallow pills. Family will at least allow me to use oxycontin in place of MS Contin with her kidneys.  This afternoon they have also hinted at taking her home without hospice so they can be in control of meds. I know her well from the past,  and there has been concerns about opioid diversion at home when [facility]. Actually her first hospice enrollment was because I no longer felt comfortable having my fellow write opioid prescriptions without the oversight of hospice.  I would not feel comfortable writing even a short script for opioid medications without hospice involvement. I have put her on oxycontin 74m TID and oxycodone 30-688mPRN with 66m80mubQ dilaudid backup.  I think she  will need frequent PRN's to control her pain. Her degree of weakness may make it difficult to ask for pain meds.  We will have to be diligent in assessing nonverbal pain symptoms  AarDoran ClayO. Palliative Medicine [facility] at [facility]: [phone] Phone: [phone]

## 2014-04-17 NOTE — Progress Notes (Signed)
Christine Vincent GBT:[id number] DOB: 10/02/1950 DOA: 04/14/2014 PCP: Eyvonne Mechanic, MD  Brief narrative: 63 year old lady with HIV not under treatment, metastatic cervical cancer, bilateral hydronephrosis, history substance abuse and cancer pain/cachexia admitted with lethargy and weakness  Past medical history-As per Problem list Chart reviewed as below- review  Consultants:  Palliative medicine  Procedures:  none  Antibiotics:  none   Subjective   More alert but in visible pain Note family has elected to use PO medications rather than Iv for pain control NO family available   Objective    Interim History: none  Telemetry: none   Objective: Filed Vitals:   04/16/14 1451 04/16/14 2100 04/17/14 0500 04/17/14 1354  BP: 149/119 146/108 141/96 136/108  Pulse:  109 106   Temp:  99.3 F (37.4 C) 98.6 F (37 C) 98.4 F (36.9 C)  TempSrc:    Oral  Resp:  14 12 20   Height:      Weight:      SpO2:  96% 100% 100%    Intake/Output Summary (Last 24 hours) at 04/17/14 1702 Last data filed at 04/17/14 0500  Gross per 24 hour  Intake    7.5 ml  Output    150 ml  Net -142.5 ml    Exam:  General:  Alert and mild pain, cachectic Abd tender Zoster rash on trunk  Data Reviewed: Basic Metabolic Panel:  Recent Labs Lab 04/14/14 0217  NA 134*  K 4.8  CL 99  CO2 17*  GLUCOSE 71  BUN 32*  CREATININE 2.02*  CALCIUM 10.4   Liver Function Tests:  Recent Labs Lab 04/14/14 0217  AST 14  ALT 6  ALKPHOS 230*  BILITOT 0.4  PROT 9.2*  ALBUMIN 2.0*    Recent Labs Lab 04/14/14 0217  LIPASE 11   No results for input(s): AMMONIA in the last 168 hours. CBC:  Recent Labs Lab 04/14/14 0217  WBC 9.6  NEUTROABS 7.9*  HGB 7.9*  HCT 25.6*  MCV 68.6*  PLT PLATELET CLUMPS NOTED ON SMEAR, COUNT APPEARS DECREASED   Cardiac Enzymes:  Recent Labs Lab 04/14/14 0217  TROPONINI <0.30   BNP: Invalid input(s): POCBNP CBG: No results for input(s): GLUCAP  in the last 168 hours.  Recent Results (from the past 240 hour(s))  Urine culture     Status: None   Collection Time: 04/14/14  4:32 AM  Result Value Ref Range Status   Specimen Description URINE, RANDOM  Final   Special Requests ADDED [id number]  Final   Culture  Setup Time   Final    04/14/2014 13:00 Performed at [facility]   Final    >=100,000 COLONIES/ML Performed at [facility]    Culture   Final    ESCHERICHIA COLI Note: Confirmed Extended Spectrum Beta-Lactamase Producer (ESBL) Performed at [facility]    Report Status 04/16/2014 FINAL  Final   Organism ID, Bacteria ESCHERICHIA COLI  Final      Susceptibility   Escherichia coli - MIC*    AMPICILLIN >=32 RESISTANT Resistant     CEFAZOLIN >=64 RESISTANT Resistant     CEFTRIAXONE RESISTANT      CIPROFLOXACIN >=4 RESISTANT Resistant     GENTAMICIN <=1 SENSITIVE Sensitive     LEVOFLOXACIN >=8 RESISTANT Resistant     NITROFURANTOIN <=16 SENSITIVE Sensitive     TOBRAMYCIN <=1 SENSITIVE Sensitive     TRIMETH/SULFA >=320 RESISTANT Resistant     IMIPENEM <=0.25 SENSITIVE Sensitive  PIP/TAZO <=4 SENSITIVE Sensitive     * ESCHERICHIA COLI     Studies:              All Imaging reviewed and is as per above notation   Scheduled Meds: . antiseptic oral rinse  7 mL Mouth Rinse BID  . dexamethasone  4 mg Oral Daily  . OxyCODONE  60 mg Oral Q8H  . valACYclovir  500 mg Oral Daily   Continuous Infusions:     Assessment/Plan: 1. HIV 2. Metastatic cervical cancer 3. Cancer cachexia 4. Intractable pain 5. Zoster     Very poor prognosis. Pain meds changed to Oxycodone 60 q 8 hr per famiyl request [place] consult has been attempted but no family around now. Start Valacyclovir + Dexamethasone for zoster. Likely home with Hospice in am  Main issue now is disposition   Code Status: DO NOT RESUSCITATE Family Communication: no family currently present Disposition  Plan: inpatient   Verneita Griffes, MD  [facility] Pager [phone] 04/17/2014, 5:02 PM    LOS: 3 days

## 2014-04-17 NOTE — Progress Notes (Signed)
04/17/14 Received TCT from RN Ginger Gleason re shingles.  States per Dr. Deitra Mayo shingles currently involving only  ONE dermatone. Contact precautions required unless shingles is disseminated then Airborne/Contact required. At this time RN will place patient on Contact Precautions.  Luther Hearing RN BSN Infection Prevention

## 2014-04-17 NOTE — Progress Notes (Signed)
IM given 04/17/14 by Zelia Yzaguirre rn 

## 2014-04-18 MED ORDER — OXYCODONE HCL 30 MG PO TABS: 30.0000 mg | ORAL_TABLET | ORAL | Status: AC | PRN

## 2014-04-18 MED ORDER — OXYCODONE HCL ER 60 MG PO T12A: 60.0000 mg | EXTENDED_RELEASE_TABLET | Freq: Three times a day (TID) | ORAL | Status: AC

## 2014-04-18 MED ORDER — FOSFOMYCIN TROMETHAMINE 3 G PO PACK
3.0000 g | PACK | Freq: Once | ORAL | Status: AC
  Administered 2014-04-18: 3 g via ORAL
  Filled 2014-04-18: qty 3

## 2014-04-18 MED ORDER — DEXAMETHASONE 1 MG/ML PO CONC: 4.0000 mg | Freq: Every day | ORAL | Status: AC

## 2014-04-18 NOTE — Progress Notes (Signed)
Christine Vincent to be D/C'd Home per MD order.  Discussed with the patient and all questions fully answered.    Medication List    STOP taking these medications        levofloxacin 250 MG tablet  Commonly known as:  LEVAQUIN     morphine 60 MG 12 hr tablet  Commonly known as:  MS CONTIN     sulfamethoxazole-trimethoprim 800-160 MG per tablet  Commonly known as:  BACTRIM DS,SEPTRA DS     traMADol 50 MG tablet  Commonly known as:  ULTRAM      TAKE these medications        dexamethasone 1 MG/ML solution  Commonly known as:  DECADRON  Take 4 mLs (4 mg total) by mouth daily.  Notes to Patient:  STEROID      LAXATIVE PO  Take 15 mLs by mouth daily.  Notes to Patient:  HELPS WITH BOWEL MOVEMENTS      ondansetron 4 MG tablet  Commonly known as:  ZOFRAN  Take 4 mg by mouth every 8 (eight) hours as needed for nausea or vomiting.  Notes to Patient:  NAUSEA MEDICATION     OxyCODONE HCl ER 60 MG T12a  Take 60 mg by mouth every 8 (eight) hours.  Notes to Patient:  LONG ACTING PAIN MEDICATION     oxycodone 30 MG immediate release tablet  Commonly known as:  ROXICODONE  Take 1 tablet (30 mg total) by mouth every 2 (two) hours as needed for severe pain.  Notes to Patient:  SHORT ACTING PAIN MEDICATION     valACYclovir 500 MG tablet  Commonly known as:  VALTREX  Take 1 tablet (500 mg total) by mouth daily.        VVS at baseline, MD aware. IV catheter discontinued intact. Site without signs and symptoms of complications. Dressing and pressure applied.  An After Visit Summary was printed and given to the patient.  D/c education completed with patient/family including follow up instructions, medication list, d/c activities limitations if indicated, with other d/c instructions as indicated by MD - patient's family able to verbalize understanding, all questions fully answered.     Patient escorted via EMS to home  Delman Cheadle 04/18/2014 1:15 PM

## 2014-04-18 NOTE — Clinical Social Work Note (Signed)
Per RNCM, patient needs transport home. RNCM has confirmed address and has requested transport be arranged for 2:00PM today. Transport arranged. DC packet on chart. CSW signing off at this time.   Liz Beach MSW, [place], [place], [phone]

## 2014-04-18 NOTE — Progress Notes (Signed)
CARE MANAGEMENT NOTE 04/18/2014  Patient:  Christine Vincent, Christine Vincent   Account Number:  [id number]  Date Initiated:  04/14/2014  Documentation initiated by:  [facility]  Subjective/Objective Assessment:   metastatic cervical CA, pain     Action/Plan:   lives with dtr, Christine Vincent # [phone]   Anticipated DC Date:  04/18/2014   Anticipated DC Plan:  [place] referral  Clinical Social Worker      DC Planning Services  CM consult      [facility] Choice  HOME HEALTH   Choice offered to / List presented to:  C-4 Adult Children        [place] arranged  HH-1 RN  [place] agency  [employer]   Status of service:  Completed, signed off Medicare Important Message given?   (If response is "NO", the following Medicare IM given date fields will be blank) Date Medicare IM given:   Medicare IM given by:   Date Additional Medicare IM given:  04/17/2014 Additional Medicare IM given by:  Christine Vincent  Discharge Disposition:  [place]  Per UR Regulation:  Reviewed for med. necessity/level of care/duration of stay  If discussed at [place] of Stay Meetings, dates discussed:    Comments:  04/18/14 [place], BSN [phone] NCM spoke with Christine Vincent with [place] and she states they are refusing home hospice at this time, so now MD states patient is wanting to Vincent home with [facility] services, referral given to NCM.  NCM spoke daughter and she states yes they want to Vincent home with [place] who they had in the past they would like [facility], and aide, referral made to Christine Vincent notified, she went in to speak with daughter, she will have someone out to see them next week which is ok with daughter.  They did not need any DME , they already have DME at home.  Patient will need ambulance , [place] notified.  04/17/14 Christine Vincent Christine Vincent from [place] here to meet with pt & family. Family not present. Family  was made aware that hospice would be coming out to meet with them. Christine Vincent has tried several times to reach family and no call back. She will remain here on unit for a bit to see if family returns. CM will continue to follow case.   04/17/14 Christine Vincent received call from dr. Deitra Mayo palliative care at 1:30 pm. He called to discuss the plan that pt's dtr wanted pca dilaudid stopped and dtr wanted pt to trial po pain medication. Not optimal plan. Informed Dr. Deitra Mayo dtr was refusing to take pt home today because of the changes in pain mgmt that untimately family requested. Discussed this is difficult case and if family ends up refusing hospice care dr Deitra Mayo would be very hesitant to write an rx for pain medications. VM to Christine Vincent at 3:30 pm to see if liason Christine Vincent from [place] had the chance to meet with family.  04/17/14 Christine Vincent 1 pm Met with Dr. Deitra Mayo at 11:30 to discuss case. he placed call to [facility] at [facility] [place] and discussed case as well. [facility] still at this point unable to state if they would accept case or give time frame to get back to this case manager. Call at 12 noon to [facility] at [facility] to state we will cancel consult and look at another hospice. Call to  second choice hospice and [facility], and per [place] they cannot accept case as they do not Vincent to that area of [place]. Discussed another hospice choice with dtr Christine Vincent and choice given for [facility] and spoke with Christine Vincent. Per Christine Vincent will be out today to meet with family. Christine Vincent/snursing made aware. Dtr now would like pca diluadid stopped and wants pt to try oral pain medications. Dtr states she will not take pt home today until she see's how pain medication is working. Will continue to follow case and follow up with [facility] after they meet with family.   04/17/14 Christine Vincent Met with pt's dtr Christine Vincent in room to discuss hopsice. Dtr would like home hospice  and states she thinks pt had [facility] & [facility] of [place] in the past and currently all the DME in home she believes is from them (hospital bed, 02, bsc, wc, rw). I asked the dtr if they did not want to use this hospice angency as it was reflected in my consult. All choices of hospice companies given in [facility]. Daughter states she would like first choice of hospice and palliative care of [place] if they would accept pt back and second choice is [facility] out of [place]. Call to [facility] at [place] and discussed case. She states she will call office to see if pt was previously on service and if they can accept the case. Christine Vincent made aware of above and also pt will need home pca diluadid. Will also need to set up ambulance transport home for pt to [address] [place]. Will cont to follow and await call back from [place] at hospice.   04/17/14 Christine Vincent IM given 04/17/14. Will meet with dtr Christine Vincent to discuss home with hospice.   04/14/2014 1020 UR completed. Chart reviewed. Jonnie Finner RN CCM Case Mgmt phone [phone]

## 2014-04-18 NOTE — Consult Note (Signed)
[facility] Met with pt, DTR- [place], brother-Justice and Dr Deitra Mayo at pt's bedside today.  Pt is not interested in Hospice services at this time.  Plan will be for pt to d/c home with brother with short supply of PO pain meds and pt will make appt with community MD for ongoing med refills/prescription management.  Gave family business card with [facility] contact info if pt changes her mind about Hospice in the future.  Thank you for referral.   Wynetta Fines, RN

## 2014-04-18 NOTE — Discharge Summary (Signed)
Physician Discharge Summary  Christine Vincent WFU:[id number] DOB: Jan 14, 1951 DOA: 04/14/2014  PCP: Eyvonne Mechanic, MD  Admit date: 04/14/2014 Discharge date: 04/18/2014  Time spent: 20 minutes  Recommendations for Outpatient Follow-up:  1. The patient has an exceedingly poor prognosis within the days to weeks  2. Recommend continued palliative input as an outpatient 3. Stop acyclovir 11/23 4. Stop Decadron at the same time 5. Treated for ESBL UTI with fosfomycin 1 dose on discharge   Discharge Diagnoses:  Principal Problem:   Chest pain Active Problems:   Acute renal failure   AIDS   Microcytic anemia   UTI (lower urinary tract infection)   Palliative care encounter   Cervical cancer   Cancer related pain   Loss of appetite   Goals of care, counseling/discussion   Shingles   Discharge Condition: guarded  Diet recommendation: liberalize diet  Filed Weights   04/15/14 1252  Weight: 32.659 kg (72 lb)    History of present illness:  63 year old lady with HIV not under treatment, metastatic cervical cancer, bilateral hydronephrosis, history substance abuse and cancer pain/cachexia admitted with lethargy and weakness She's had a exceedingly complex course at [facility] and is currently not under hospice care. Palliative medicine consulted and recommended goals of care and initially patient was on Dilaudid PCA for pain control, however patient and family decided to go without hospice and palliative care and wanted to be discharged home without this. It is unclear whether they completely comprehend the severity of their relatives illness.  Patient was found to have ESBL Alan nephritis and treated with 1 dose fosfomycin Patient also was found to have shingles across her left chest and treated with Valtrex and Decadron Patient had maximally benefited from hospital stay and was discharged home with very limited amounts of pain medications as patient has history of  medication diversion She represented to the emergency room, would consider carefully need for admission  Consultations:   palliative care  Discharge Exam: Filed Vitals:   04/18/14 0544  BP: 152/129  Pulse:   Temp: 97.6 F (36.4 C)  Resp: 20    General: alert more oriented Cardiovascular: S2 no murmur rub or gallop Respiratory: clinically clear  rash acrossLeft chest  Discharge Instructions You were cared for by a hospitalist during your hospital stay. If you have any questions about your discharge medications or the care you received while you were in the hospital after you are discharged, you can call the unit and asked to speak with the hospitalist on call if the hospitalist that took care of you is not available. Once you are discharged, your primary care physician will handle any further medical issues. Please note that NO REFILLS for any discharge medications will be authorized once you are discharged, as it is imperative that you return to your primary care physician (or establish a relationship with a primary care physician if you do not have one) for your aftercare needs so that they can reassess your need for medications and monitor your lab values.  Discharge Instructions    Diet - low sodium heart healthy    Complete by:  As directed      Discharge instructions    Complete by:  As directed   Consider hospice care as an outpatient New been given a very limited amount of pain medications and U will need follow-up with the pain physician or her regular physician for this. You had a urinary tract infection this admission which we'll treat  You  have shingles and will need to have this taken care of with Valtrex I will prescribe for you and give you some steroids as well which will help     Increase activity slowly    Complete by:  As directed           Current Discharge Medication List    START taking these medications   Details  dexamethasone (DECADRON) 1 MG/ML  solution Take 4 mLs (4 mg total) by mouth daily. Qty: 60 mL, Refills: 0    valACYclovir (VALTREX) 500 MG tablet Take 1 tablet (500 mg total) by mouth daily. Qty: 100 tablet, Refills: 0      CONTINUE these medications which have CHANGED   Details  oxyCODONE (ROXICODONE) 30 MG immediate release tablet Take 1 tablet (30 mg total) by mouth every 2 (two) hours as needed for severe pain. Qty: 15 tablet, Refills: 0    OxyCODONE 60 MG T12A Take 60 mg by mouth every 8 (eight) hours. Qty: 9 tablet, Refills: 0      CONTINUE these medications which have NOT CHANGED   Details  Bisacodyl (LAXATIVE PO) Take 15 mLs by mouth daily.    ondansetron (ZOFRAN) 4 MG tablet Take 4 mg by mouth every 8 (eight) hours as needed for nausea or vomiting.      STOP taking these medications     morphine (MS CONTIN) 60 MG 12 hr tablet      sulfamethoxazole-trimethoprim (BACTRIM DS,SEPTRA DS) 800-160 MG per tablet      traMADol (ULTRAM) 50 MG tablet      levofloxacin (LEVAQUIN) 250 MG tablet        Allergies  Allergen Reactions  . Adhesive [Tape] Itching and Other (See Comments)    "Burns skin"  . Aspirin Hives, Itching and Nausea And Vomiting  . Pork-Derived Metallurgist and Other (See Comments)      The results of significant diagnostics from this hospitalization (including imaging, microbiology, ancillary and laboratory) are listed below for reference.    Significant Diagnostic Studies: Dg Chest Port 1 View  04/14/2014   CLINICAL DATA:  Chest pain.  EXAM: PORTABLE CHEST - 1 VIEW  COMPARISON:  PA and lateral chest 09/09/2013 and 08/19/2012.  FINDINGS: The lungs appear emphysematous but are clear. No pneumothorax or pleural effusion is identified. Heart size is normal. No focal bony abnormality is identified.  IMPRESSION: Emphysema without acute disease.   Electronically Signed   By: Inge Rise M.D.   On: 04/14/2014 02:59    Microbiology: Recent Results (from the past 240 hour(s))  Urine  culture     Status: None   Collection Time: 04/14/14  4:32 AM  Result Value Ref Range Status   Specimen Description URINE, RANDOM  Final   Special Requests ADDED [id number]  Final   Culture  Setup Time   Final    04/14/2014 13:00 Performed at [facility]   Final    >=100,000 COLONIES/ML Performed at [facility]    Culture   Final    ESCHERICHIA COLI Note: Confirmed Extended Spectrum Beta-Lactamase Producer (ESBL) Performed at [facility]    Report Status 04/16/2014 FINAL  Final   Organism ID, Bacteria ESCHERICHIA COLI  Final      Susceptibility   Escherichia coli - MIC*    AMPICILLIN >=32 RESISTANT Resistant     CEFAZOLIN >=64 RESISTANT Resistant     CEFTRIAXONE RESISTANT      CIPROFLOXACIN >=4  RESISTANT Resistant     GENTAMICIN <=1 SENSITIVE Sensitive     LEVOFLOXACIN >=8 RESISTANT Resistant     NITROFURANTOIN <=16 SENSITIVE Sensitive     TOBRAMYCIN <=1 SENSITIVE Sensitive     TRIMETH/SULFA >=320 RESISTANT Resistant     IMIPENEM <=0.25 SENSITIVE Sensitive     PIP/TAZO <=4 SENSITIVE Sensitive     * ESCHERICHIA COLI     Labs: Basic Metabolic Panel:  Recent Labs Lab 04/14/14 0217  NA 134*  K 4.8  CL 99  CO2 17*  GLUCOSE 71  BUN 32*  CREATININE 2.02*  CALCIUM 10.4   Liver Function Tests:  Recent Labs Lab 04/14/14 0217  AST 14  ALT 6  ALKPHOS 230*  BILITOT 0.4  PROT 9.2*  ALBUMIN 2.0*    Recent Labs Lab 04/14/14 0217  LIPASE 11   No results for input(s): AMMONIA in the last 168 hours. CBC:  Recent Labs Lab 04/14/14 0217  WBC 9.6  NEUTROABS 7.9*  HGB 7.9*  HCT 25.6*  MCV 68.6*  PLT PLATELET CLUMPS NOTED ON SMEAR, COUNT APPEARS DECREASED   Cardiac Enzymes:  Recent Labs Lab 04/14/14 0217  TROPONINI <0.30   BNP: BNP (last 3 results)  Recent Labs  04/14/14 0217  PROBNP 1200.0*   CBG: No results for input(s): GLUCAP in the last 168 hours.     SignedNita Sells  [facility] 04/18/2014, 9:27 AM

## 2014-04-18 NOTE — Progress Notes (Signed)
Patient Christine Vincent      DOB: 11/07/1950      ZMC:[id number]   I have reached out to primary Palliative MD Dr Nancie Neas about follow-up. Unfortunately Dr Nancie Neas will be out of office all this week. I have reached out to [facility] to see if one of his colleagues can arrange clinic follow-up for her.  I would not alter the amount of pain meds she is discharged with. They will need to follow-up with [facility] to see if they will provide meds for her without hospice involvement.  They do have number for hospice as well, so plan for getting these pain meds at home is available to them.    Doran Clay D.O. Palliative Medicine Team at [facility]  Pager: [phone] Team Phone: [phone]

## 2014-04-18 NOTE — Progress Notes (Addendum)
Patient WU:GQBVQ [place]      DOB: [birth year]      XIH:[id number]   Palliative Medicine Team at [facility] Progress Note    Subjective: Rowen is more awake today with decreased dose of pain medicine and conversion to oral route.  Still very lethargic/weak. Doubt she can get out of bed.  Fortunately her pain seems mostly controlled.  Steroids may have helped here as well.      Filed Vitals:   04/18/14 0544  BP: 152/129  Pulse:   Temp: 97.6 F (36.4 C)  Resp: 20   Physical exam: GEN: lethargic, but able to converse a little more today. Extremely cachectic HEENT: [place], sclera anicteric CV: RRR LUNGS: CTAB, symm exp ABD: soft,very tender SKIN: herpetic rash over left T6 dermatome stable and starting to crust. Does not cross midline    Assessment and plan: 63 yo female with HIV, metastatic cervical CA, bilateral hydronephrosis 2/2 malignancy (refused neph tubes), h/o substance abuse and difficult to control cancer related pain who presented with lethargy, weakness of several days duration.   1. Code Status: DNR  2. Goals of Care:  While she is slightly more alert today with oral pain med adjustments I made along with dose reduction. She is still weak, extremely cachectic and I think has prognosis likely in weeks if she continues to be stable from here. They met with [facility] this morning and refused their services. I informed them that I would only be willing to allow a few days of pain medication without hospice care (this was the case when she was my [facility] patient as well and the reason hospice services were initially started). There was concerns about opioid diversion from hospice even last time she was enrolled. Part of the reason she ended up in hospital is that she ran out of pain medications.  Justice believes she was not taking MS Contin, while daughter states she was.  They both state that she was taking more meds than prescribed at times due to severe  nature of pain.  They would like to treat urinary tract infection today as well. They plan on taking her home without hospice. They will call Dr Nancie Neas today to see if he will continue to write her pain medications as outpatient. They have [facility]'s contact information should they be willing to re-engage. She is at high risk for decompensation and repeat hospitalization in the short term.  I still feel like she is dying from her cancer and hope she does not end up in crisis situation again at home which she has multiple times in the past.  While she is slightly more alert today her palliative performance status is still only about 20-30 at best.    3. Symptom Management:  1. Cancer Related Pain: Would only provide 3 day supply of oxycontin and PRN oxycodone.  Can continue steroids which may be helping not only with her pain allowing decrease in MEDD, but also perking her up a little.   2. 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  3. Shingles- would complete course of valtrex as outpatient.   4. E. Coli UTI- MDR. Chronic foley.  Unable to place new IV here. Dose of fosfomycin should cover. I discussed with family that given her obstructive uropathy and chronic indwelling foley needs this is highly likely to recur.   4. Psychosocial/Spiritual: Very challenging situation. Maiya frankly has had a difficult life. She  often has had conflict in all her personal relationships (even with family). The first time I met her, she kicked me out of the room and would only see our Palliative Fellow. Her poor social situation has been complicated by drug abuse. She was with several different hospice agencies and discharged due to difficulty in her care and compliance. Recently moved to [place] for couple of months to see family. Now back in [place] for past few months and had been following back at [facility]. Has always coped poorly  with her disease. Had been staying at home with her daughter and support from her brother Justice.   Total Time: 30 minutes  >50% of time spent in counseling and coordination of care regarding above assessment and plan  Doran Clay D.O. Palliative Medicine Team at [facility]  Pager: [phone] Team Phone: [phone]

## 2014-05-02 DEATH — deceased
